# Patient Record
Sex: Male | Born: 1950 | Race: Black or African American | Hispanic: No | Marital: Married | Smoking: Never smoker
Health system: Southern US, Community
[De-identification: ages and names within clinical notes are randomized; demographics above are authoritative.]

## PROBLEM LIST (undated history)

## (undated) DIAGNOSIS — E785 Hyperlipidemia, unspecified: Secondary | ICD-10-CM

## (undated) DIAGNOSIS — R7303 Prediabetes: Secondary | ICD-10-CM

## (undated) DIAGNOSIS — I340 Nonrheumatic mitral (valve) insufficiency: Secondary | ICD-10-CM

## (undated) HISTORY — DX: Nonrheumatic mitral (valve) insufficiency: I34.0

## (undated) HISTORY — DX: Prediabetes: R73.03

## (undated) HISTORY — DX: Hyperlipidemia, unspecified: E78.5

---

## 2007-08-22 ENCOUNTER — Emergency Department (HOSPITAL_COMMUNITY): Admission: EM | Admit: 2007-08-22 | Discharge: 2007-08-22 | Payer: Self-pay | Admitting: Family Medicine

## 2008-01-03 ENCOUNTER — Emergency Department (HOSPITAL_COMMUNITY): Admission: EM | Admit: 2008-01-03 | Discharge: 2008-01-03 | Payer: Self-pay | Admitting: Emergency Medicine

## 2008-01-27 ENCOUNTER — Emergency Department (HOSPITAL_COMMUNITY): Admission: EM | Admit: 2008-01-27 | Discharge: 2008-01-27 | Payer: Self-pay | Admitting: Family Medicine

## 2008-05-18 ENCOUNTER — Emergency Department (HOSPITAL_COMMUNITY): Admission: EM | Admit: 2008-05-18 | Discharge: 2008-05-18 | Payer: Self-pay | Admitting: Emergency Medicine

## 2009-02-08 ENCOUNTER — Emergency Department (HOSPITAL_COMMUNITY): Admission: EM | Admit: 2009-02-08 | Discharge: 2009-02-08 | Payer: Self-pay | Admitting: Emergency Medicine

## 2009-04-11 ENCOUNTER — Emergency Department (HOSPITAL_COMMUNITY): Admission: EM | Admit: 2009-04-11 | Discharge: 2009-04-12 | Payer: Self-pay | Admitting: Emergency Medicine

## 2009-04-22 ENCOUNTER — Emergency Department (HOSPITAL_COMMUNITY): Admission: EM | Admit: 2009-04-22 | Discharge: 2009-04-22 | Payer: Self-pay | Admitting: Family Medicine

## 2009-04-29 ENCOUNTER — Encounter: Admission: RE | Admit: 2009-04-29 | Discharge: 2009-04-29 | Payer: Self-pay | Admitting: Orthopedic Surgery

## 2009-05-03 ENCOUNTER — Encounter: Admission: RE | Admit: 2009-05-03 | Discharge: 2009-05-03 | Payer: Self-pay | Admitting: Orthopedic Surgery

## 2009-05-07 ENCOUNTER — Encounter: Admission: RE | Admit: 2009-05-07 | Discharge: 2009-06-18 | Payer: Self-pay | Admitting: Orthopedic Surgery

## 2009-05-28 ENCOUNTER — Encounter: Admission: RE | Admit: 2009-05-28 | Discharge: 2009-05-28 | Payer: Self-pay | Admitting: Orthopedic Surgery

## 2009-06-01 ENCOUNTER — Encounter: Admission: RE | Admit: 2009-06-01 | Discharge: 2009-06-01 | Payer: Self-pay | Admitting: Orthopedic Surgery

## 2010-11-25 LAB — POCT URINALYSIS DIP (DEVICE)
Bilirubin Urine: NEGATIVE
Ketones, ur: NEGATIVE mg/dL
Operator id: 235561
pH: 6 (ref 5.0–8.0)

## 2011-04-28 ENCOUNTER — Encounter (HOSPITAL_COMMUNITY): Payer: Self-pay | Admitting: Emergency Medicine

## 2011-04-28 ENCOUNTER — Emergency Department (INDEPENDENT_AMBULATORY_CARE_PROVIDER_SITE_OTHER)
Admission: EM | Admit: 2011-04-28 | Discharge: 2011-04-28 | Disposition: A | Discharge status: Home / Self Care | Payer: BC Managed Care – PPO | Source: Home / Self Care | Attending: Family Medicine | Admitting: Family Medicine

## 2011-04-28 DIAGNOSIS — R05 Cough: Secondary | ICD-10-CM

## 2011-04-28 DIAGNOSIS — J310 Chronic rhinitis: Secondary | ICD-10-CM

## 2011-04-28 MED ORDER — FLUTICASONE PROPIONATE 50 MCG/ACT NA SUSP: 2.0000 | Freq: Every day | NASAL | Status: DC

## 2011-04-28 MED ORDER — HYDROCOD POLST-CHLORPHEN POLST 10-8 MG/5ML PO LQCR: 5.0000 mL | Freq: Two times a day (BID) | ORAL | Status: DC | PRN

## 2011-04-28 NOTE — Discharge Instructions (Signed)
Use the prescribed nasal spray as directed. Use the prescription cough syrup, as needed and as directed. Should you have aches, fever, or chills, I recommend aggressive control with acetaminophen (Tylenol) and/or ibuprofen. You may use these together, alternating them every 4 hours, or individually, every 8 hours. For example, take acetaminophen 500 to 1000 mg at 12 noon, then 600 to 800 mg of ibuprofen at 4 pm, then acetaminophen at 8 pm, etc. Also, stay hydrated with clear liquids. Return to care should your symptoms not improve, or worsen in any way.

## 2011-04-28 NOTE — ED Notes (Signed)
C/o cough that started Monday.  Denies runny nose now, but c/o chest congestion and pain in chest with coughing.  Denies fever at home.  Denies headache, denies general aches

## 2011-05-29 NOTE — ED Provider Notes (Signed)
History     CSN: [id number]  Arrival date & time 04/28/11  1819   First MD Initiated Contact with Patient 04/28/11 1914      Chief Complaint  Patient presents with  . Cough    (Consider location/radiation/quality/duration/timing/severity/associated sxs/prior treatment) HPI Comments: Cristian Tran presents for evaluation of cough, nasal congestion. He denies any fever.   Patient is a 61 y.o. male presenting with cough. The history is provided by the patient.  Cough This is a new problem. The current episode started 2 days ago. The problem occurs constantly. The cough is non-productive. Associated symptoms include chest pain. Pertinent negatives include no chills, no rhinorrhea, no shortness of breath and no wheezing.    History reviewed. No pertinent past medical history.  History reviewed. No pertinent past surgical history.  History reviewed. No pertinent family history.  History  Substance Use Topics  . Smoking status: Never Smoker   . Smokeless tobacco: Not on file  . Alcohol Use: No      Review of Systems  Constitutional: Negative.  Negative for chills.  HENT: Positive for congestion. Negative for rhinorrhea.   Eyes: Negative.   Respiratory: Positive for cough. Negative for shortness of breath and wheezing.   Cardiovascular: Positive for chest pain.  Gastrointestinal: Negative.   Genitourinary: Negative.   Musculoskeletal: Negative.   Skin: Negative.   Neurological: Negative.     Allergies  Review of patient's allergies indicates no known allergies.  Home Medications   Current Outpatient Rx  Name Route Sig Dispense Refill  . BENADRYL ALLERGY/COLD PO Oral Take by mouth.    Marland Kitchen HALLS COUGH DROPS MT Mouth/Throat Use as directed in the mouth or throat.    Marland Kitchen HYDROCOD POLST-CPM POLST ER 10-8 MG/5ML PO LQCR Oral Take 5 mLs by mouth every 12 (twelve) hours as needed. 140 mL 0  . FLUTICASONE PROPIONATE 50 MCG/ACT NA SUSP Nasal Place 2 sprays into the nose daily. 16 g 2     BP 155/89  Pulse 80  Temp(Src) 99.2 F (37.3 C) (Oral)  Resp 18  SpO2 100%  Physical Exam  Nursing note and vitals reviewed. Constitutional: He is oriented to person, place, and time. He appears well-developed and well-nourished.  HENT:  Head: Normocephalic and atraumatic.  Right Ear: Tympanic membrane is retracted.  Left Ear: Tympanic membrane is retracted.  Mouth/Throat: Uvula is midline, oropharynx is clear and moist and mucous membranes are normal.  Eyes: EOM are normal.  Neck: Normal range of motion.  Cardiovascular: Normal rate, regular rhythm, S1 normal, S2 normal and normal heart sounds.   No murmur heard. Pulmonary/Chest: Effort normal and breath sounds normal. He has no decreased breath sounds. He has no wheezes. He has no rhonchi.  Musculoskeletal: Normal range of motion.  Neurological: He is alert and oriented to person, place, and time.  Skin: Skin is warm and dry.  Psychiatric: His behavior is normal.    ED Course  Procedures (including critical care time)  Labs Reviewed - No data to display No results found.   1. Rhinitis   2. Cough       MDM  rx given for fluticasone and Tussionex Pennkinetic syrup        Renaee Munda, MD 05/29/11 2139

## 2013-04-26 ENCOUNTER — Emergency Department (HOSPITAL_COMMUNITY)
Admission: EM | Admit: 2013-04-26 | Discharge: 2013-04-26 | Disposition: A | Discharge status: Home / Self Care | Payer: BC Managed Care – PPO | Attending: Emergency Medicine | Admitting: Emergency Medicine

## 2013-04-26 ENCOUNTER — Encounter (HOSPITAL_COMMUNITY): Payer: Self-pay | Admitting: Emergency Medicine

## 2013-04-26 DIAGNOSIS — L0291 Cutaneous abscess, unspecified: Secondary | ICD-10-CM

## 2013-04-26 DIAGNOSIS — L02419 Cutaneous abscess of limb, unspecified: Secondary | ICD-10-CM | POA: Insufficient documentation

## 2013-04-26 DIAGNOSIS — L03119 Cellulitis of unspecified part of limb: Principal | ICD-10-CM

## 2013-04-26 MED ORDER — SULFAMETHOXAZOLE-TMP DS 800-160 MG PO TABS: 1.0000 | ORAL_TABLET | Freq: Two times a day (BID) | ORAL | Status: DC

## 2013-04-26 MED ORDER — CEPHALEXIN 500 MG PO CAPS: 500.0000 mg | ORAL_CAPSULE | Freq: Four times a day (QID) | ORAL | Status: DC

## 2013-04-26 NOTE — ED Provider Notes (Signed)
CSN: [id number]     Arrival date & time 04/26/13  2029 History  This chart was scribed for non-physician practitioner, Arthor CaptainAbigail Amisadai Woodford, PA-C working with Lyanne CoKevin M Campos, MD by Greggory StallionKayla Andersen, ED scribe. This patient was seen in room TR07C/TR07C and the patient's care was started at 8:49 PM.   Chief Complaint  Patient presents with  . Abscess   The history is provided by the patient. No language interpreter was used.   HPI Comments: Cristian Tran is a 63 y.o. male who presents to the Emergency Department complaining of an abscess to his left knee that started 2 days ago. He states it started out as itchy but is now also mildly painful. Pt has put peroxide over the area with no relief. Denies fever, chills.   History reviewed. No pertinent past medical history. History reviewed. No pertinent past surgical history. No family history on file. History  Substance Use Topics  . Smoking status: Never Smoker   . Smokeless tobacco: Not on file  . Alcohol Use: Yes    Review of Systems  Constitutional: Negative for fever and chills.  HENT: Negative for congestion.   Eyes: Negative for redness.  Respiratory: Negative for shortness of breath.   Cardiovascular: Negative for chest pain.  Gastrointestinal: Negative for abdominal distention.  Musculoskeletal: Negative for gait problem.  Skin: Positive for color change.       Positive for abscess.  Neurological: Negative for speech difficulty.  Psychiatric/Behavioral: Negative for confusion.   Allergies  Review of patient's allergies indicates no known allergies.  Home Medications   Current Outpatient Rx  Name  Route  Sig  Dispense  Refill  . chlorpheniramine-HYDROcodone (TUSSIONEX PENNKINETIC ER) 10-8 MG/5ML LQCR   Oral   Take 5 mLs by mouth every 12 (twelve) hours as needed.   140 mL   0   . Diphenhydramine-PE-APAP (BENADRYL ALLERGY/COLD PO)   Oral   Take by mouth.         . EXPIRED: fluticasone (FLONASE) 50 MCG/ACT nasal spray    Nasal   Place 2 sprays into the nose daily.   16 g   2   . Throat Lozenges (HALLS COUGH DROPS MT)   Mouth/Throat   Use as directed in the mouth or throat.          BP 142/80  Pulse 51  Temp(Src) 98.7 F (37.1 C) (Oral)  Resp 18  SpO2 98%  Physical Exam  Nursing note and vitals reviewed. Constitutional: He is oriented to person, place, and time. He appears well-developed and well-nourished. No distress.  HENT:  Head: Normocephalic and atraumatic.  Eyes: EOM are normal.  Neck: Neck supple. No tracheal deviation present.  Cardiovascular: Normal rate.   Pulmonary/Chest: Effort normal. No respiratory distress.  Musculoskeletal: Normal range of motion.  4 cm x 3 cm area of erythema and induration to left knee. Poorly demarcated. Central crusting with easily expressible purulent discharge from a central lesion. No fluctuance.   Neurological: He is alert and oriented to person, place, and time.  Skin: Skin is warm and dry.  Psychiatric: He has a normal mood and affect. His behavior is normal.    ED Course  Procedures (including critical care time)  DIAGNOSTIC STUDIES: Oxygen Saturation is 98% on RA, normal by my interpretation.    COORDINATION OF CARE: 8:52 PM-Discussed treatment plan which includes I&D and an antibiotic with pt at bedside and pt agreed to plan.  INCISION AND DRAINAGE Performed by: Arthor CaptainAbigail Zanae Kuehnle, PA-C  Consent: Verbal consent obtained. Risks and benefits: risks, benefits and alternatives were discussed Type: abscess  Body area: left knee  Anesthesia: local infiltration  Incision was made with a scalpel.  Local anesthetic: 2% lidocaine with epi  Anesthetic total: 1.5 ml  Complexity: complex Blunt dissection to break up loculations  Drainage: purulent  Drainage amount: minimal  Packing material: none   Patient tolerance: Patient tolerated the procedure well with no immediate complications.  Labs Review Labs Reviewed - No data to  display Imaging Review No results found.   EKG Interpretation None      MDM   Final diagnoses:  Abscess    Patient with skin abscess amenable to incision and drainage. . Encouraged home warm soaks and flushing.  Mild signs of cellulitis isnsurrounding skin.  Will d/c to home.  patient given abx to use only if signs of worsening infection  I personally performed the services described in this documentation, which was scribed in my presence. The recorded information has been reviewed and is accurate.    Arthor Captain, PA-C 04/26/13 2127

## 2013-04-26 NOTE — ED Notes (Signed)
Pt. reports abscess at left knee onset 2 days ago with no drainage .

## 2013-04-26 NOTE — Discharge Instructions (Signed)
SEEK MEDICAL CARE IF:   You notice red streaks coming from the infected area.  Your red area gets larger or turns dark in color.  Your bone or joint underneath the infected area becomes painful after the skin has healed.  Your infection returns in the same area or another area.  You notice a swollen bump in the infected area.  You develop new symptoms. SEEK IMMEDIATE MEDICAL CARE IF:   You have a fever.  You feel very sleepy.  You develop vomiting or diarrhea.  You have a general ill feeling (malaise) with muscle aches and pains.  Abscess An abscess is an infected area that contains a collection of pus and debris.It can occur in almost any part of the body. An abscess is also known as a furuncle or boil. CAUSES  An abscess occurs when tissue gets infected. This can occur from blockage of oil or sweat glands, infection of hair follicles, or a minor injury to the skin. As the body tries to fight the infection, pus collects in the area and creates pressure under the skin. This pressure causes pain. People with weakened immune systems have difficulty fighting infections and get certain abscesses more often.  SYMPTOMS Usually an abscess develops on the skin and becomes a painful mass that is red, warm, and tender. If the abscess forms under the skin, you may feel a moveable soft area under the skin. Some abscesses break open (rupture) on their own, but most will continue to get worse without care. The infection can spread deeper into the body and eventually into the bloodstream, causing you to feel ill.  DIAGNOSIS  Your caregiver will take your medical history and perform a physical exam. A sample of fluid may also be taken from the abscess to determine what is causing your infection. TREATMENT  Your caregiver may prescribe antibiotic medicines to fight the infection. However, taking antibiotics alone usually does not cure an abscess. Your caregiver may need to make a small cut (incision)  in the abscess to drain the pus. In some cases, gauze is packed into the abscess to reduce pain and to continue draining the area. HOME CARE INSTRUCTIONS   Only take over-the-counter or prescription medicines for pain, discomfort, or fever as directed by your caregiver.  If you were prescribed antibiotics, take them as directed. Finish them even if you start to feel better.  If gauze is used, follow your caregiver's directions for changing the gauze.  To avoid spreading the infection:  Keep your draining abscess covered with a bandage.  Wash your hands well.  Do not share personal care items, towels, or whirlpools with others.  Avoid skin contact with others.  Keep your skin and clothes clean around the abscess.  Keep all follow-up appointments as directed by your caregiver. MAKE SURE YOU:   Understand these instructions.  Will watch your condition.  Will get help right away if you are not doing well or get worse. Document Released: 11/19/2004 Document Revised: 08/11/2011 Document Reviewed: 04/24/2011 [facility] Patient Information 2014 [employer], [place].

## 2013-04-28 NOTE — ED Provider Notes (Signed)
Medical screening examination/treatment/procedure(s) were performed by non-physician practitioner and as supervising physician I was immediately available for consultation/collaboration.   EKG Interpretation None        Laticia Vannostrand M Marah Park, MD 04/28/13 0003 

## 2014-10-12 ENCOUNTER — Emergency Department (HOSPITAL_COMMUNITY)
Admission: EM | Admit: 2014-10-12 | Discharge: 2014-10-12 | Disposition: A | Discharge status: Home / Self Care | Payer: BLUE CROSS/BLUE SHIELD | Source: Home / Self Care | Attending: Emergency Medicine | Admitting: Emergency Medicine

## 2014-10-12 DIAGNOSIS — L989 Disorder of the skin and subcutaneous tissue, unspecified: Secondary | ICD-10-CM | POA: Diagnosis not present

## 2014-10-12 MED ORDER — TRIAMCINOLONE ACETONIDE 0.5 % EX CREA: 1.0000 "application " | TOPICAL_CREAM | Freq: Two times a day (BID) | CUTANEOUS | Status: DC

## 2014-10-12 NOTE — ED Provider Notes (Signed)
CSN: [id number]     Arrival date & time 10/12/14  1652 History   First MD Initiated Contact with Patient 10/12/14 1738     Chief Complaint  Patient presents with  . Rash   (Consider location/radiation/quality/duration/timing/severity/associated sxs/prior Treatment) HPI He is a 64 year old man here for evaluation of skin rash. He states there is a spot on his elbow that has been there for about a month. It is raised and itchy. He has tried multiple over-the-counter creams including cortisone, and anti-fungal cream, and anti-bacterial creams without improvement. He states he put peroxide on it which helped the itching. He reports spending a lot of time outdoors in the woods.  No past medical history on file. No past surgical history on file. No family history on file. Social History  Substance Use Topics  . Smoking status: Never Smoker   . Smokeless tobacco: Not on file  . Alcohol Use: Yes    Review of Systems As in history of present illness Allergies  Review of patient's allergies indicates no known allergies.  Home Medications   Prior to Admission medications   Medication Sig Start Date End Date Taking? Authorizing Provider  triamcinolone cream (KENALOG) 0.5 % Apply 1 application topically 2 (two) times daily. For 1 week 10/12/14   Charm Rings, MD   BP 132/81 mmHg  Pulse 55  Temp(Src) 97.4 F (36.3 C) (Oral)  Resp 16  SpO2 100% Physical Exam  Constitutional: He is oriented to person, place, and time. He appears well-developed and well-nourished. No distress.  Cardiovascular: Normal rate.   Pulmonary/Chest: Effort normal.  Neurological: He is alert and oriented to person, place, and time.  Skin:  1 cm raised scaly lesion just distal to the elbow on the dorsal forearm    ED Course  Procedures (including critical care time) Labs Review Labs Reviewed - No data to display  Imaging Review No results found.   MDM   1. Skin lesion    This seems most consistent  with chronic irritation versus precancerous skin lesion. Seborrheic keratosis is also possible. We'll treat with triamcinolone cream twice daily for 1 week. If no improvement, he will follow-up with the dermatologist for biopsy.    Charm Rings, MD 10/12/14 [phone]

## 2014-10-12 NOTE — Discharge Instructions (Signed)
This looks like a contact irritant. I do not think it is ringworm. It could also be a type of precancerous skin lesion. Please use the triamcinolone cream twice a day for the next week. If it is not getting better in 1 week, please call the dermatologist.

## 2014-10-12 NOTE — ED Notes (Signed)
Rash arm x couple of weeks

## 2015-03-29 ENCOUNTER — Encounter (HOSPITAL_COMMUNITY): Payer: Self-pay | Admitting: Emergency Medicine

## 2015-03-29 ENCOUNTER — Emergency Department (HOSPITAL_COMMUNITY)
Admission: EM | Admit: 2015-03-29 | Discharge: 2015-03-29 | Disposition: A | Discharge status: Home / Self Care | Payer: BLUE CROSS/BLUE SHIELD | Source: Home / Self Care | Attending: Family Medicine | Admitting: Family Medicine

## 2015-03-29 DIAGNOSIS — J069 Acute upper respiratory infection, unspecified: Secondary | ICD-10-CM | POA: Diagnosis not present

## 2015-03-29 NOTE — ED Notes (Signed)
C/o chest congestion onset yest associated w/a dry cough Denies runny nose, fevers, SOB, dyspnea A&O x4... No acute distress.

## 2015-03-29 NOTE — ED Provider Notes (Signed)
CSN: [id number]     Arrival date & time 03/29/15  1657 History   First MD Initiated Contact with Patient 03/29/15 1826     Chief Complaint  Patient presents with  . URI   (Consider location/radiation/quality/duration/timing/severity/associated sxs/prior Treatment) HPI History obtained from patient:   LOCATION:upper resp SEVERITY: no pain DURATION:1 day CONTEXT:sudden onset QUALITY:dry harsh cough MODIFYING FACTORS: musinex, alka seltzer plus ASSOCIATED SYMPTOMS: cough TIMING: contant OCCUPATION:landscaper  History reviewed. No pertinent past medical history. History reviewed. No pertinent past surgical history. No family history on file. Social History  Substance Use Topics  . Smoking status: Never Smoker   . Smokeless tobacco: None  . Alcohol Use: Yes    Review of Systems ROS +'ve cough  Denies: HEADACHE, NAUSEA, ABDOMINAL PAIN, CHEST PAIN, CONGESTION, DYSURIA, SHORTNESS OF BREATH  Allergies  Review of patient's allergies indicates no known allergies.  Home Medications   Prior to Admission medications   Medication Sig Start Date End Date Taking? Authorizing Provider  triamcinolone cream (KENALOG) 0.5 % Apply 1 application topically 2 (two) times daily. For 1 week 10/12/14   Charm Rings, MD   Meds Ordered and Administered this Visit  Medications - No data to display  BP 163/94 mmHg  Pulse 96  Temp(Src) 98.4 F (36.9 C) (Oral)  SpO2 99% No data found.   Physical Exam  Constitutional: He is oriented to person, place, and time. He appears well-developed and well-nourished. No distress.  HENT:  Head: Normocephalic and atraumatic.  Right Ear: External ear normal.  Left Ear: External ear normal.  Mouth/Throat: Oropharynx is clear and moist. No oropharyngeal exudate.  Eyes: Conjunctivae are normal.  Neck: Normal range of motion. Neck supple.  Pulmonary/Chest: Effort normal.  Abdominal: Soft.  Neurological: He is alert and oriented to person, place, and time.   Skin: Skin is warm and dry.  Psychiatric: He has a normal mood and affect. His behavior is normal.  Nursing note and vitals reviewed.   ED Course  Procedures (including critical care time)  Labs Review Labs Reviewed - No data to display  Imaging Review No results found.   Visual Acuity Review  Right Eye Distance:   Left Eye Distance:   Bilateral Distance:    Right Eye Near:   Left Eye Near:    Bilateral Near:         MDM   1. Acute URI    Patient is advised to continue home symptomatic treatment.  Patient is advised that if there are new or worsening symptoms or attend the emergency department, or contact primary care provider. Instructions of care provided discharged home in stable condition. Return to work/school note provided.  THIS NOTE WAS GENERATED USING A VOICE RECOGNITION SOFTWARE PROGRAM. ALL REASONABLE EFFORTS  WERE MADE TO PROOFREAD THIS DOCUMENT FOR ACCURACY.     Tharon Aquas, PA 03/29/15 [place]

## 2015-03-29 NOTE — Discharge Instructions (Signed)

## 2015-04-04 MED ORDER — AMOXICILLIN 500 MG PO CAPS
500.0000 mg | ORAL_CAPSULE | Freq: Three times a day (TID) | ORAL | Status: DC
Start: 2015-04-04 — End: 2021-07-23

## 2015-04-14 ENCOUNTER — Emergency Department (HOSPITAL_COMMUNITY): Payer: BLUE CROSS/BLUE SHIELD

## 2015-04-14 ENCOUNTER — Encounter (HOSPITAL_COMMUNITY): Payer: Self-pay | Admitting: Emergency Medicine

## 2015-04-14 ENCOUNTER — Emergency Department (HOSPITAL_COMMUNITY)
Admission: EM | Admit: 2015-04-14 | Discharge: 2015-04-14 | Disposition: A | Discharge status: Home / Self Care | Payer: BLUE CROSS/BLUE SHIELD | Attending: Emergency Medicine | Admitting: Emergency Medicine

## 2015-04-14 DIAGNOSIS — S4991XA Unspecified injury of right shoulder and upper arm, initial encounter: Secondary | ICD-10-CM | POA: Insufficient documentation

## 2015-04-14 DIAGNOSIS — Y998 Other external cause status: Secondary | ICD-10-CM | POA: Diagnosis not present

## 2015-04-14 DIAGNOSIS — M25511 Pain in right shoulder: Secondary | ICD-10-CM

## 2015-04-14 DIAGNOSIS — X58XXXA Exposure to other specified factors, initial encounter: Secondary | ICD-10-CM | POA: Insufficient documentation

## 2015-04-14 DIAGNOSIS — Z792 Long term (current) use of antibiotics: Secondary | ICD-10-CM | POA: Insufficient documentation

## 2015-04-14 DIAGNOSIS — Y9389 Activity, other specified: Secondary | ICD-10-CM | POA: Diagnosis not present

## 2015-04-14 DIAGNOSIS — Y9289 Other specified places as the place of occurrence of the external cause: Secondary | ICD-10-CM | POA: Insufficient documentation

## 2015-04-14 MED ORDER — NAPROXEN 500 MG PO TABS: 500.0000 mg | ORAL_TABLET | Freq: Two times a day (BID) | ORAL | Status: DC

## 2015-04-14 NOTE — ED Provider Notes (Signed)
History  By signing my name below, I, Karle Plumber, attest that this documentation has been prepared under the direction and in the presence of Melburn Hake, [place]. Electronically Signed: Karle Plumber, ED Scribe. 04/14/2015. 12:29 PM.  Chief Complaint  Patient presents with  . Shoulder Injury   The history is provided by the patient and medical records. No language interpreter was used.    HPI Comments:  Cristian Tran is a 65 y.o. male who presents to the Emergency Department complaining of right constant, sharp shoulder pain that started three days ago secondary to lifting a couch. Pt also reports working on sheet rock with his arms elevated over his head a couple weeks ago. He states the pain starts in the posterior right shoulder and radiates down to the right elbow. Pt reports taking Ibuprofen and applying heat with no significant relief of the pain. He states he has applied ice compresses with some relief of the pain. He denies modifying factors. He denies any previous injury to the shoulder. He denies weakness of the right shoulder or RUE, bruising, swelling, numbness or wounds. He denies any other injury, trauma or fall. He does not have a PCP. His orthopedist is Dr. Montez Morita and he plans on following up with him.   History reviewed. No pertinent past medical history. History reviewed. No pertinent past surgical history. History reviewed. No pertinent family history. Social History  Substance Use Topics  . Smoking status: Never Smoker   . Smokeless tobacco: None  . Alcohol Use: Yes    Review of Systems  Musculoskeletal: Positive for myalgias.  Skin: Negative for color change and wound.  Neurological: Negative for weakness and numbness.    Allergies  Review of patient's allergies indicates no known allergies.  Home Medications   Prior to Admission medications   Medication Sig Start Date End Date Taking? Authorizing Provider  amoxicillin (AMOXIL) 500 MG capsule  Take 1 capsule (500 mg total) by mouth 3 (three) times daily. 04/04/15   Tharon Aquas, PA  naproxen (NAPROSYN) 500 MG tablet Take 1 tablet (500 mg total) by mouth 2 (two) times daily. 04/14/15   Barrett Henle, PA-C  triamcinolone cream (KENALOG) 0.5 % Apply 1 application topically 2 (two) times daily. For 1 week 10/12/14   Charm Rings, MD   Triage Vitals: BP 148/80 mmHg  Pulse 61  Temp(Src) 97.7 F (36.5 C) (Oral)  Resp 20  SpO2 100% Physical Exam  Constitutional: He is oriented to person, place, and time. He appears well-developed and well-nourished.  HENT:  Head: Normocephalic and atraumatic.  Eyes: EOM are normal.  Neck: Normal range of motion.  Cardiovascular: Normal rate.   Cap refill less than 2 seconds. 2+ radial pulse to RUE.  Pulmonary/Chest: Effort normal.  Musculoskeletal: Normal range of motion.       Right shoulder: He exhibits tenderness. He exhibits normal range of motion, no bony tenderness, no swelling, no effusion, no crepitus, no deformity, no laceration, no spasm, normal pulse and normal strength.  Mild tenderness over posterior right deltoid and lateral latissimus muscles. Full ROM.  Neurological: He is alert and oriented to person, place, and time.  5/5 strength to right shoulder, elbow, wrist and hand. Distal sensations intact.  Skin: Skin is warm and dry.  Psychiatric: He has a normal mood and affect. His behavior is normal.  Nursing note and vitals reviewed.   ED Course  Procedures (including critical care time) DIAGNOSTIC STUDIES: Oxygen Saturation is 100% on RA,  normal by my interpretation.   COORDINATION OF CARE: 12:25 PM- Informed pt that his X-Ray was negative. Will prescribe Naproxen. Pt verbalizes understanding and agrees to plan.  Medications - No data to display  Labs Review Labs Reviewed - No data to display  Imaging Review Dg Shoulder Right  04/14/2015  CLINICAL DATA:  Right shoulder pain after lifting injury EXAM: RIGHT  SHOULDER - 2+ VIEW COMPARISON:  None. FINDINGS: There is no evidence of fracture or dislocation. There is no evidence of arthropathy or other focal bone abnormality. Soft tissues are unremarkable. IMPRESSION: Negative. Electronically Signed   By: Delbert Phenix M.D.   On: 04/14/2015 10:53   I have personally reviewed and evaluated these images and lab results as part of my medical decision-making.    MDM   Final diagnoses:  Right shoulder pain    Patient X-Ray negative for obvious fracture or dislocation.  Pt advised to follow up with his orthopedist, Dr. Montez Morita. Conservative therapy recommended and discussed. Patient will be discharged home & is agreeable with above plan. Returns precautions discussed. Pt appears safe for discharge.  I personally performed the services described in this documentation, which was scribed in my presence. The recorded information has been reviewed and is accurate.     Satira Sark Leroy, [place] 04/14/15 1238  Vanetta Mulders, MD 04/16/15 [phone]

## 2015-04-14 NOTE — Discharge Instructions (Signed)
Take her medications as prescribed. Recommend continuing to apply ice to affected area for 15-20 minutes 3-4 times daily for pain relief. Refrain from doing any heavy lifting, excessive exercising or repetitive movements that exacerbate your symptoms for the next few days. Please follow up with a primary care provider from the Resource Guide provided below in 1 week as needed. Please return to the Emergency Department if symptoms worsen or new onset of swelling, numbness, tingling, weakness.    Emergency Department Resource Guide 1) Find a Doctor and Pay Out of Pocket Although you won't have to find out who is covered by your insurance plan, it is a good idea to ask around and get recommendations. You will then need to call the office and see if the doctor you have chosen will accept you as a new patient and what types of options they offer for patients who are self-pay. Some doctors offer discounts or will set up payment plans for their patients who do not have insurance, but you will need to ask so you aren't surprised when you get to your appointment.  2) Contact Your Local Health Department Not all health departments have doctors that can see patients for sick visits, but many do, so it is worth a call to see if yours does. If you don't know where your local health department is, you can check in your phone book. The CDC also has a tool to help you locate your state's health department, and many state websites also have listings of all of their local health departments.  3) Find a Walk-in Clinic If your illness is not likely to be very severe or complicated, you may want to try a walk in clinic. These are popping up all over the country in pharmacies, drugstores, and shopping centers. They're usually staffed by nurse practitioners or physician assistants that have been trained to treat common illnesses and complaints. They're usually fairly quick and inexpensive. However, if you have serious medical  issues or chronic medical problems, these are probably not your best option.  No Primary Care Doctor: - Call Health Connect at  [phone] - they can help you locate a primary care doctor that  accepts your insurance, provides certain services, etc. - Physician Referral Service- [phone]  Chronic Pain Problems: Organization         Address  Phone   Notes  [facility]  [phone] Patients need to be referred by their primary care doctor.   Medication Assistance: Organization         Address  Phone   Notes  [facility] Medication [facility] [address] [place] [place], [place] [address] [phone] --Must be a resident of [facility] -- Must have NO insurance coverage whatsoever (no Medicaid/ Medicare, etc.) -- The pt. MUST have a primary care doctor that directs their care regularly and follows them in the community   [facility]  [phone]   [employer]  [phone]    Agencies that provide inexpensive medical care: Organization         Address  Phone   Notes  [facility]  [phone]   [facility]    [phone]   [facility] [address] [place], [place] [address] [phone]   [facility] [address] [place] [place] [phone]   Planned Parenthood    [phone]   [facility]    [phone]  [phone]   [place] [address], [place] Phone:  [phone], Fax:  [phone] Hours of Operation:  9 am - 6 pm, M-F.  Also accepts Medicaid/Medicare and self-pay.  [facility] [place], [address], [place] Phone: [phone], Fax: [phone]. Hours of Operation:  8:30 am - 5:30 pm, M-F.  Also accepts Medicaid and self-pay.  [facility] [address] [place] Phone: [phone]   [place], [place], [place]  [phone], Ext. [phone] Mondays & Thursdays: 7-9 AM.  First 15 patients are seen on a first come, first serve basis.    [place] Providers:  Organization         Address  Phone   Notes  [facility] [address], [place] [phone] Also accepts self-pay patients.  [facility] [address], [place]  [phone]   [place], [address], [place] [phone]   [facility] [address] [place] [phone]   Lucianne Lei [address], [place]   [phone] Only accepts [place] Access [place] patients after they have their name applied to their card.   Self-Pay (no insurance) in [facility]:  Organization         Address  Phone   Notes  Sickle Cell Patients, [facility] Internal [facility] [place] [phone]   [facility] [place] [phone]   [facility] [place]  [place], [place], [place] [phone]   [facility]/Dr. Osei-Bonsu  [address], [place] or [address], [place] [phone] Phone number for both [place] and [place] locations is the same.  [facility] [address] [place] [phone]   [facility] [address] [place] or [address] [phone]   [facility] [address] [place] [phone], phone; [phone], fax Sees patients 1st and 3rd Saturday of every month.  Must not qualify for public or private insurance (i.e. Medicaid, Medicare, [place] Health Choice, Veterans' Benefits)  Household income should be no more than 200% of the poverty level The clinic cannot treat you if you are pregnant or think you are pregnant  Sexually transmitted  diseases are not treated at the clinic.    Dental Care: Organization         Address  Phone  Notes  [facility] [phone] Accepts children up to age 10 who are enrolled in [place] or [place]; pregnant women with a Medicaid card; and children who have applied for Medicaid or [place] Health Choice, but were declined, whose parents can pay a reduced fee at time of service.  [facility]  [address], [place] [phone] Accepts children up to age 22 who are enrolled in [place] or [place]; pregnant women with a Medicaid card; and children who have applied for Medicaid or [place] Health Choice, but were declined, whose parents can pay a reduced fee at time of service.  [place]  Access PROGRAM  [address] [place] [phone] Patients are seen by appointment only. Walk-ins are not accepted. [place] will see patients 57 years of age and older. Monday - Tuesday (8am-5pm) Most Wednesdays (8:30-5pm) $30 per visit, cash only  [facility] Adult Dental Access PROGRAM  [address], [place] [facility] [phone] Patients are seen by appointment only. Walk-ins are not accepted. [place] will see patients 61 years of age and older. One Wednesday Evening (Monthly: Volunteer Based).  $30 per visit, cash only  [place]  [phone] for adults; Children under age 78, call Graduate Pediatric Dentistry at [phone]. Children aged 60-14, please call [phone] to request a pediatric application.  Dental services are provided in all areas of dental care including fillings, crowns and bridges, complete and partial dentures, implants, gum treatment, root canals, and extractions. Preventive care is also provided. Treatment is provided to both adults and children. Patients are selected via a  lottery and there is often a waiting list.   [facility] [address] [place]  [phone] [email or web address]   [facility] [address] [place], [place] [phone], Ext. [phone] Second and Fourth Thursday of each month, opens at 6:30 AM; Clinic ends at 9 AM.  Patients are seen on a first-come first-served basis, and a limited number are seen during each clinic.   [facility]  [address] Hillard Danker [place], [place] [phone]   Eligibility Requirements You must have lived in [place], [place], or [place] counties for at least the last three months.   You cannot be eligible for state or federal sponsored [employer], including [employer], [place], or [employer].   You generally cannot be eligible for healthcare insurance through your employer.    How to apply: Eligibility screenings are held every Tuesday and Wednesday afternoon from 1:00 pm until 4:00 pm. You do not need an appointment for the interview!  [facility] [address], [place], [place]   [place]  [place] Department  [place]  [phone]    Behavioral Health Resources in the Community: Intensive Outpatient Programs Organization         Address  Phone  Notes  [place] [address], [place], [place] [phone]   [facility] Outpatient [address], [place], [place]   ADS: Alcohol [facility] Drug S[facility] [address], [place], [place]   [place] [address]  [place], [place] or [phone]   Substance Abuse Resources Organization         Address  Phone  Notes  Alcohol and Drug Services  [phone]   [place]  [phone]   [facility] [place]   Chinita Pester  [phone]   Residential &  Outpatient Substance Abuse Program  [phone]   Psychological Services Organization         Address  Phone  Notes  [facility] [place]  [place]  [phone]   [place] [address] [place] or [phone]    Mobile Crisis Teams Organization         Address  Phone  Notes  [facility], Mobile Crisis Care Unit  [phone]   [facility]  [address] [place],  [place] [phone]   [facility] [address] [place] [phone]    Self-Help/Support Groups Organization         Address  Phone             Notes  [facility] [place] - variety of support groups  336- I7437963 Call for more information  Narcotics Anonymous (NA), [facility] [address], [employer] [place]  2 meetings at this location   Statistician         Address  Phone  Notes  [facility] [address],    [place]  [phone]   [facility]  [address] [place] [address], [place], [place] [phone]   [facility] Treatment Facility [address] [place], [place] [phone] Admissions: 8am-3pm M-F  [facility] [address],    [place], [place] [phone]   [facility] [address] [place], [place], [place] [phone]   [facility] ([place]) [address],  [place], [place] [phone]   [facility] - Intensive Outpatient [address], [place], [place] [phone]   [facility] (Addiction Recovery [facility] [address] [place].,  [place], [place] [phone] or [phone]   [facility] ([facility]) [address] [place], [place] [phone] Accepts Medicaid  [facility] [address]  [place] [phone] Substance Abuse/Addiction Treatment   [facility] Organization          Address  Phone  Notes  [facility]  [phone]   Angie Fava, PhD [address] Ervin Knack [place], [place]   [phone] or [phone]   [facility]   [address] [place], [place] [phone]   [facility] [address] [place], [place] [phone] Insurance/Medicaid/sponsorship through [facility] and Families [address]                                    [place], [place] [phone] Therapy/tele-psych/case  [facility] [address], [place] [phone]    Dr. Lolly Mustache  [phone]   [facility]  [employer] [place] [facility] Dept. 1) [address],  2) [address] [place] 3)  [address], [place] [phone]  [phone]   [facility] Child Abuse Hotline 941-625-5283 or [phone] (After Hours)

## 2015-04-14 NOTE — ED Notes (Addendum)
Pt lifted a couch Thursday, since then began having right shoulder pain radiating down into right elbow. States the pain has been worsening, able to move all joints with pain, no obvious deformities.

## 2015-08-22 ENCOUNTER — Encounter (HOSPITAL_COMMUNITY): Payer: Self-pay | Admitting: Emergency Medicine

## 2015-08-22 ENCOUNTER — Emergency Department (HOSPITAL_COMMUNITY)
Admission: EM | Admit: 2015-08-22 | Discharge: 2015-08-22 | Disposition: A | Discharge status: Home / Self Care | Payer: BLUE CROSS/BLUE SHIELD | Attending: Emergency Medicine | Admitting: Emergency Medicine

## 2015-08-22 DIAGNOSIS — W57XXXA Bitten or stung by nonvenomous insect and other nonvenomous arthropods, initial encounter: Secondary | ICD-10-CM | POA: Insufficient documentation

## 2015-08-22 DIAGNOSIS — Y999 Unspecified external cause status: Secondary | ICD-10-CM | POA: Diagnosis not present

## 2015-08-22 DIAGNOSIS — Y929 Unspecified place or not applicable: Secondary | ICD-10-CM | POA: Diagnosis not present

## 2015-08-22 DIAGNOSIS — S80861A Insect bite (nonvenomous), right lower leg, initial encounter: Secondary | ICD-10-CM | POA: Diagnosis not present

## 2015-08-22 DIAGNOSIS — Y939 Activity, unspecified: Secondary | ICD-10-CM | POA: Diagnosis not present

## 2015-08-22 NOTE — ED Notes (Signed)
PT DISCHARGED. INSTRUCTIONS GIVEN. AAOX4. PT IN NO APPARENT DISTRESS OR PAIN. THE OPPORTUNITY TO ASK QUESTIONS WAS PROVIDED. 

## 2015-08-22 NOTE — ED Provider Notes (Signed)
CSN: [id number]     Arrival date & time 08/22/15  1647 History  By signing my name below, I, Evon Slackerrance Branch, attest that this documentation has been prepared under the direction and in the presence of Audry Piliyler Chia Mowers, PA-C. Electronically Signed: Evon Slackerrance Branch, ED Scribe. 08/22/2015. 5:49 PM.  Chief Complaint  Patient presents with  . Insect Bite   The history is provided by the patient. No language interpreter was used.   HPI Comments: Cristian Tran is a 65 y.o. male who presents to the Emergency Department complaining of possible insect bites to right upper and right lower leg onset 2 days priors. Pt states that they are itchy. 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 Pt states that he has applied cortisone cream with some relief. Denies redness, discharge, rash, fever, nausea, vomiting or abdominal pain.   History reviewed. No pertinent past medical history. History reviewed. No pertinent past surgical history. History reviewed. No pertinent family history. Social History  Substance Use Topics  . Smoking status: Never Smoker   . Smokeless tobacco: None  . Alcohol Use: Yes    Review of Systems  Constitutional: Negative for fever.  Gastrointestinal: Negative for nausea, vomiting and abdominal pain.  Skin: Negative for color change and rash.   Allergies  Review of patient's allergies indicates no known allergies.  Home Medications   Prior to Admission medications   Medication Sig Start Date End Date Taking? Authorizing Provider  amoxicillin (AMOXIL) 500 MG capsule Take 1 capsule (500 mg total) by mouth 3 (three) times daily. 04/04/15   Tharon AquasFrank C Patrick, PA  naproxen (NAPROSYN) 500 MG tablet Take 1 tablet (500 mg total) by mouth 2 (two) times daily. 04/14/15   Barrett HenleNicole Elizabeth Nadeau, PA-C  triamcinolone cream (KENALOG) 0.5 % Apply 1 application topically 2 (two) times daily. For 1 week 10/12/14   Charm RingsErin J Honig, MD   BP 118/76 mmHg  Pulse 65   Temp(Src) 97.7 F (36.5 C) (Oral)  Resp 14  Ht 6\' 4"  (1.93 m)  Wt 190 lb (86.183 kg)  BMI 23.14 kg/m2  SpO2 100%   Physical Exam  Constitutional: He is oriented to person, place, and time. He appears well-developed and well-nourished. No distress.  HENT:  Head: Normocephalic and atraumatic.  Eyes: Conjunctivae and EOM are normal.  Neck: Neck supple. No tracheal deviation present.  Cardiovascular: Normal rate and regular rhythm.   Pulmonary/Chest: Effort normal. No respiratory distress.  Abdominal: Soft.  Musculoskeletal: Normal range of motion.  Neurological: He is alert and oriented to person, place, and time.  Skin: Skin is warm and dry.  Scattered <771mm lesions (4 total) on right leg. No erythema. No purulence. No induration. No signs of infection.   Psychiatric: He has a normal mood and affect. His behavior is normal. Thought content normal.  Nursing note and vitals reviewed.  ED Course  Procedures (including critical care time) DIAGNOSTIC STUDIES: Oxygen Saturation is 100% on RA, normal by my interpretation.    COORDINATION OF CARE: 5:47 PM-Discussed treatment plan with pt at bedside and pt agreed to plan.   Labs Review Labs Reviewed - No data to display  Imaging Review No results found.    EKG Interpretation None      MDM  I have reviewed the relevant previous healthcare records. I obtained HPI from historian.  ED Course:  Assessment: Pt is a 64yM with scattered <141mm lesions from possible insect bote on right leg.  Pt works outdoors. No pain. Mild itching. On exam, pt in NAD. Nontoxic/nonseptic appearing. VSS. Afebrile. Lesions non erythematous. Non infectious. No necrosis.counseled on clamaine lotion and hydrocortisone cream for itching. Plan is to DC home with follow up to PCP. At time of discharge, Patient is in no acute distress. Vital Signs are stable. Patient is able to ambulate. Patient able to tolerate PO.     Disposition/Plan:  DC Home Additional  Verbal discharge instructions given and discussed with patient.  Pt Instructed to f/u with PCP in the next week for evaluation and treatment of symptoms. Return precautions given Pt acknowledges and agrees with plan  Supervising Physician Tilden FossaElizabeth Rees, MD   Final diagnoses:  Insect bites    I personally performed the services described in this documentation, which was scribed in my presence. The recorded information has been reviewed and is accurate.     Audry Piliyler Cecil Bixby, PA-C 08/22/15 1755  Tilden FossaElizabeth Rees, MD 08/24/15 [phone]

## 2015-08-22 NOTE — ED Notes (Signed)
Pt has insect bites to back of R thigh and R ankle.

## 2015-08-22 NOTE — Discharge Instructions (Signed)
Please read and follow all provided instructions.  Your diagnoses today include:  1. Insect bites    Tests performed today include:  Vital signs. See below for your results today.   Medications prescribed:   Take as prescribed   Home care instructions:  Follow any educational materials contained in this packet.  You can use a hydrocortisone cream or calamine lotion to help with the itching   Follow-up instructions: Please follow-up with your primary care provider for further evaluation of symptoms and treatment   Return instructions:   Please return to the Emergency Department if you do not get better, if you get worse, or new symptoms OR  - Fever (temperature greater than 101.35F)  - Bleeding that does not stop with holding pressure to the area    -Severe pain (please note that you may be more sore the day after your accident)  - Chest Pain  - Difficulty breathing  - Severe nausea or vomiting  - Inability to tolerate food and liquids  - Passing out  - Skin becoming red around your wounds  - Change in mental status (confusion or lethargy)  - New numbness or weakness     Please return if you have any other emergent concerns.  Additional Information:  Your vital signs today were: BP 118/76 mmHg   Pulse 65   Temp(Src) 97.7 F (36.5 C) (Oral)   Resp 14   Ht 6\' 4"  (1.93 m)   Wt 86.183 kg   BMI 23.14 kg/m2   SpO2 100% If your blood pressure (BP) was elevated above 135/85 this visit, please have this repeated by your doctor within one month. ---------------

## 2016-08-01 ENCOUNTER — Emergency Department (HOSPITAL_COMMUNITY)
Admission: EM | Admit: 2016-08-01 | Discharge: 2016-08-01 | Disposition: A | Discharge status: Home / Self Care | Payer: BLUE CROSS/BLUE SHIELD | Attending: Emergency Medicine | Admitting: Emergency Medicine

## 2016-08-01 ENCOUNTER — Encounter (HOSPITAL_COMMUNITY): Payer: Self-pay

## 2016-08-01 DIAGNOSIS — Y92009 Unspecified place in unspecified non-institutional (private) residence as the place of occurrence of the external cause: Secondary | ICD-10-CM | POA: Diagnosis not present

## 2016-08-01 DIAGNOSIS — Y939 Activity, unspecified: Secondary | ICD-10-CM | POA: Diagnosis not present

## 2016-08-01 DIAGNOSIS — X088XXA Exposure to other specified smoke, fire and flames, initial encounter: Secondary | ICD-10-CM | POA: Diagnosis not present

## 2016-08-01 DIAGNOSIS — H5712 Ocular pain, left eye: Secondary | ICD-10-CM | POA: Diagnosis present

## 2016-08-01 DIAGNOSIS — S0502XA Injury of conjunctiva and corneal abrasion without foreign body, left eye, initial encounter: Secondary | ICD-10-CM | POA: Insufficient documentation

## 2016-08-01 DIAGNOSIS — Y999 Unspecified external cause status: Secondary | ICD-10-CM | POA: Insufficient documentation

## 2016-08-01 MED ORDER — FLUORESCEIN SODIUM 0.6 MG OP STRP
1.0000 | ORAL_STRIP | Freq: Once | OPHTHALMIC | Status: AC
Start: 2016-08-01 — End: 2016-08-01
  Administered 2016-08-01: 1 via OPHTHALMIC
  Filled 2016-08-01: qty 1

## 2016-08-01 MED ORDER — ERYTHROMYCIN 5 MG/GM OP OINT: TOPICAL_OINTMENT | OPHTHALMIC | 0 refills | Status: DC

## 2016-08-01 MED ORDER — TETRACAINE HCL 0.5 % OP SOLN
2.0000 [drp] | Freq: Once | OPHTHALMIC | Status: AC
  Administered 2016-08-01: 2 [drp] via OPHTHALMIC
  Filled 2016-08-01: qty 4

## 2016-08-01 NOTE — ED Triage Notes (Signed)
Pt reports the presence of suspected foreign body in his left eye. Pt presents with redness, irritation, and watery left eye. Pt does not recall an object entering his left eye.

## 2016-08-01 NOTE — ED Notes (Signed)
Pt able to see with no problem at this time. Pt denies any blurred vision

## 2016-08-01 NOTE — Discharge Instructions (Signed)
It was my pleasure taking care of you today!   Apply topical antibiotic ointment to eye as directed. Please call the ophthalmologist listed or your own eye doctor to schedule a follow up appointment.   Return to ER for new or worsening symptoms, any additional concerns.

## 2016-08-01 NOTE — ED Provider Notes (Signed)
[facility]-EMERGENCY DEPT Provider Note   CSN: [id number] Arrival date & time: 08/01/16  4098     History   Chief Complaint Chief Complaint  Patient presents with  . Foreign Body in Eye    Left    HPI Cristian Tran is a 66 y.o. male.  The history is provided by the patient and medical records. No language interpreter was used.  Foreign Body in Eye    Cristian Tran is a 66 y.o. 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 He does not remember anything in particular getting in his eye, however does note that there was a good bit of smoke, ashes, etc. In the air while he was cooking. Pain with blinking, but if he keeps his eye shut, there is no pain. He tried to flush the eye out with water last night - little improvement. No medications or drops prior to arrival. No history of similar. No change in vision. Eyes will intermittently water, but no other discharge. Does not itch.   History reviewed. No pertinent past medical history.  There are no active problems to display for this patient.   History reviewed. No pertinent surgical history.     Home Medications    Prior to Admission medications   Medication Sig Start Date End Date Taking? Authorizing Provider  amoxicillin (AMOXIL) 500 MG capsule Take 1 capsule (500 mg total) by mouth 3 (three) times daily. 04/04/15   Tharon Aquas, PA  erythromycin ophthalmic ointment Place a 1/2 inch ribbon of ointment into the lower eyelid 4x per day. 08/01/16   Ward, Chase Picket, PA-C  naproxen (NAPROSYN) 500 MG tablet Take 1 tablet (500 mg total) by mouth 2 (two) times daily. 04/14/15   Barrett Henle, PA-C  triamcinolone cream (KENALOG) 0.5 % Apply 1 application topically 2 (two) times daily. For 1 week 10/12/14   Charm Rings, MD    Family History History reviewed. No pertinent family history.  Social History Social  History  Substance Use Topics  . Smoking status: Never Smoker  . Smokeless tobacco: Not on file  . Alcohol use Yes     Allergies   Patient has no known allergies.   Review of Systems Review of Systems  Constitutional: Negative for fever.  HENT: Negative for congestion and sore throat.   Eyes: Positive for pain and redness. Negative for photophobia, itching and visual disturbance.     Physical Exam Updated Vital Signs BP (!) 153/90 (BP Location: Left Arm)   Pulse 62   Temp 97.7 F (36.5 C) (Oral)   Resp 16   SpO2 100%   Physical Exam  Constitutional: He appears well-developed and well-nourished. No distress.  HENT:  Head: Normocephalic and atraumatic.  Nose: Nose normal.  Mouth/Throat: Oropharynx is clear and moist.  Eyes: EOM and lids are normal. Pupils are equal, round, and reactive to light. Lids are everted and swept, no foreign bodies found.  Slit lamp exam:      The left eye shows corneal abrasion and fluorescein uptake.  Neck: Neck supple.  Cardiovascular: Normal rate, regular rhythm and normal heart sounds.   No murmur heard. Pulmonary/Chest: Effort normal and breath sounds normal. No respiratory distress. He has no wheezes. He has no rales.  Musculoskeletal: Normal range of motion.  Neurological: He is alert.  Skin: Skin is warm and dry.  Nursing note and vitals reviewed.  ED Treatments / Results  Labs (all labs ordered are listed, but only abnormal results are displayed) Labs Reviewed - No data to display  EKG  EKG Interpretation None       Radiology No results found.  Procedures Procedures (including critical care time)  Medications Ordered in ED Medications  tetracaine (PONTOCAINE) 0.5 % ophthalmic solution 2 drop (2 drops Left Eye Given 08/01/16 0659)  fluorescein ophthalmic strip 1 strip (1 strip Left Eye Given 08/01/16 0659)     Initial Impression / Assessment and Plan / ED Course  I have reviewed the triage vital signs and the  nursing notes.  Pertinent labs & imaging results that were available during my care of the patient were reviewed by me and considered in my medical decision making (see chart for details).    Cristian Tran is a 66 y.o. male who presents to ED for left eye irritation and redness. 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 Fluorescein uptake c/w corneal abrasion on exam. Eye was thoroughly irrigated and examined, including eyelid eversion with no evidence of foreign body. Exam not concerning for orbital cellulitis or hyphema. No concern for uveitis. Patient will be discharged home with erythromycin ointment.  Patient understands to follow up with ophthalmology. Return precautions discussed. Patient appears safe for discharge and all questions were answered.  Final Clinical Impressions(s) / ED Diagnoses   Final diagnoses:  Abrasion of left cornea, initial encounter    New Prescriptions New Prescriptions   ERYTHROMYCIN OPHTHALMIC OINTMENT    Place a 1/2 inch ribbon of ointment into the lower eyelid 4x per day.     Ward, Chase PicketJaime Pilcher, PA-C 08/01/16 [id number]    Azalia Bilisampos, Kevin, MD 08/01/16 1004

## 2017-11-26 IMAGING — CR DG SHOULDER 2+V*R*
3 series · 3 of 3 positions shown · non-contrast
Comparison: None.

CLINICAL DATA: Right shoulder pain after lifting injury

EXAM:
RIGHT SHOULDER - 2+ VIEW

[w shoulder external right]
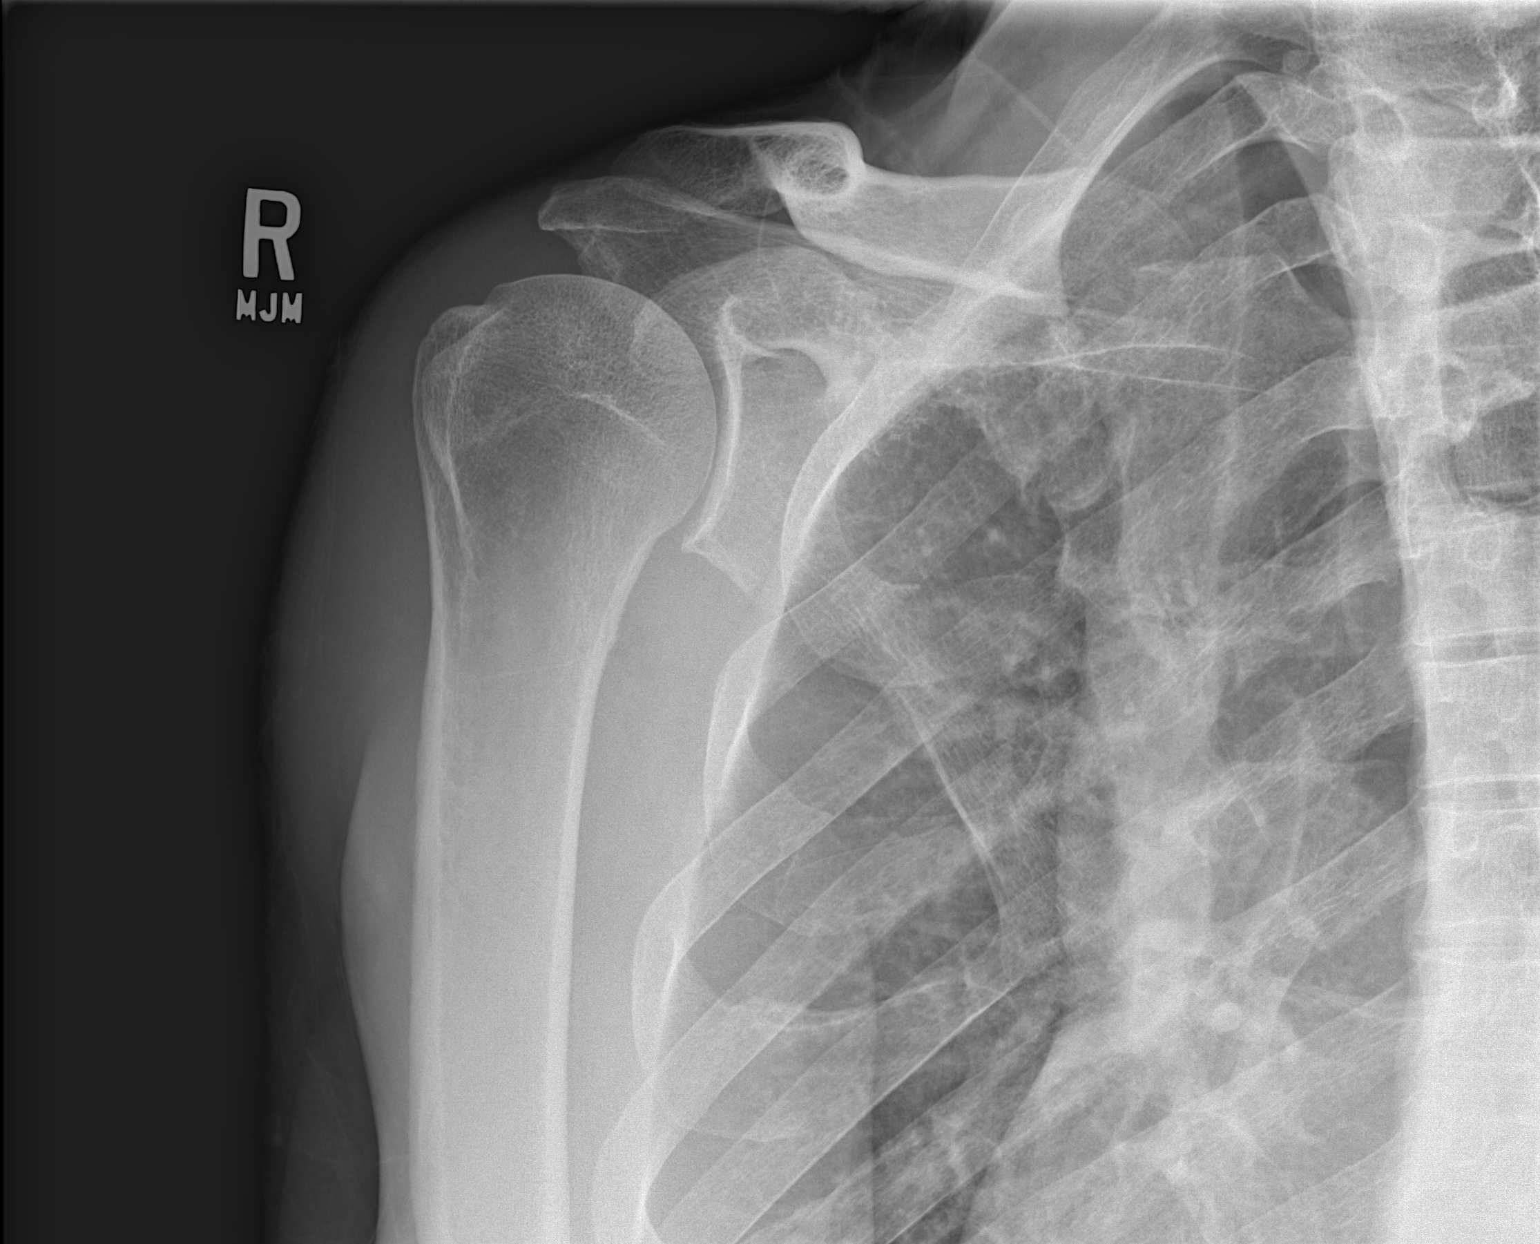

[w shoulder y-view right]
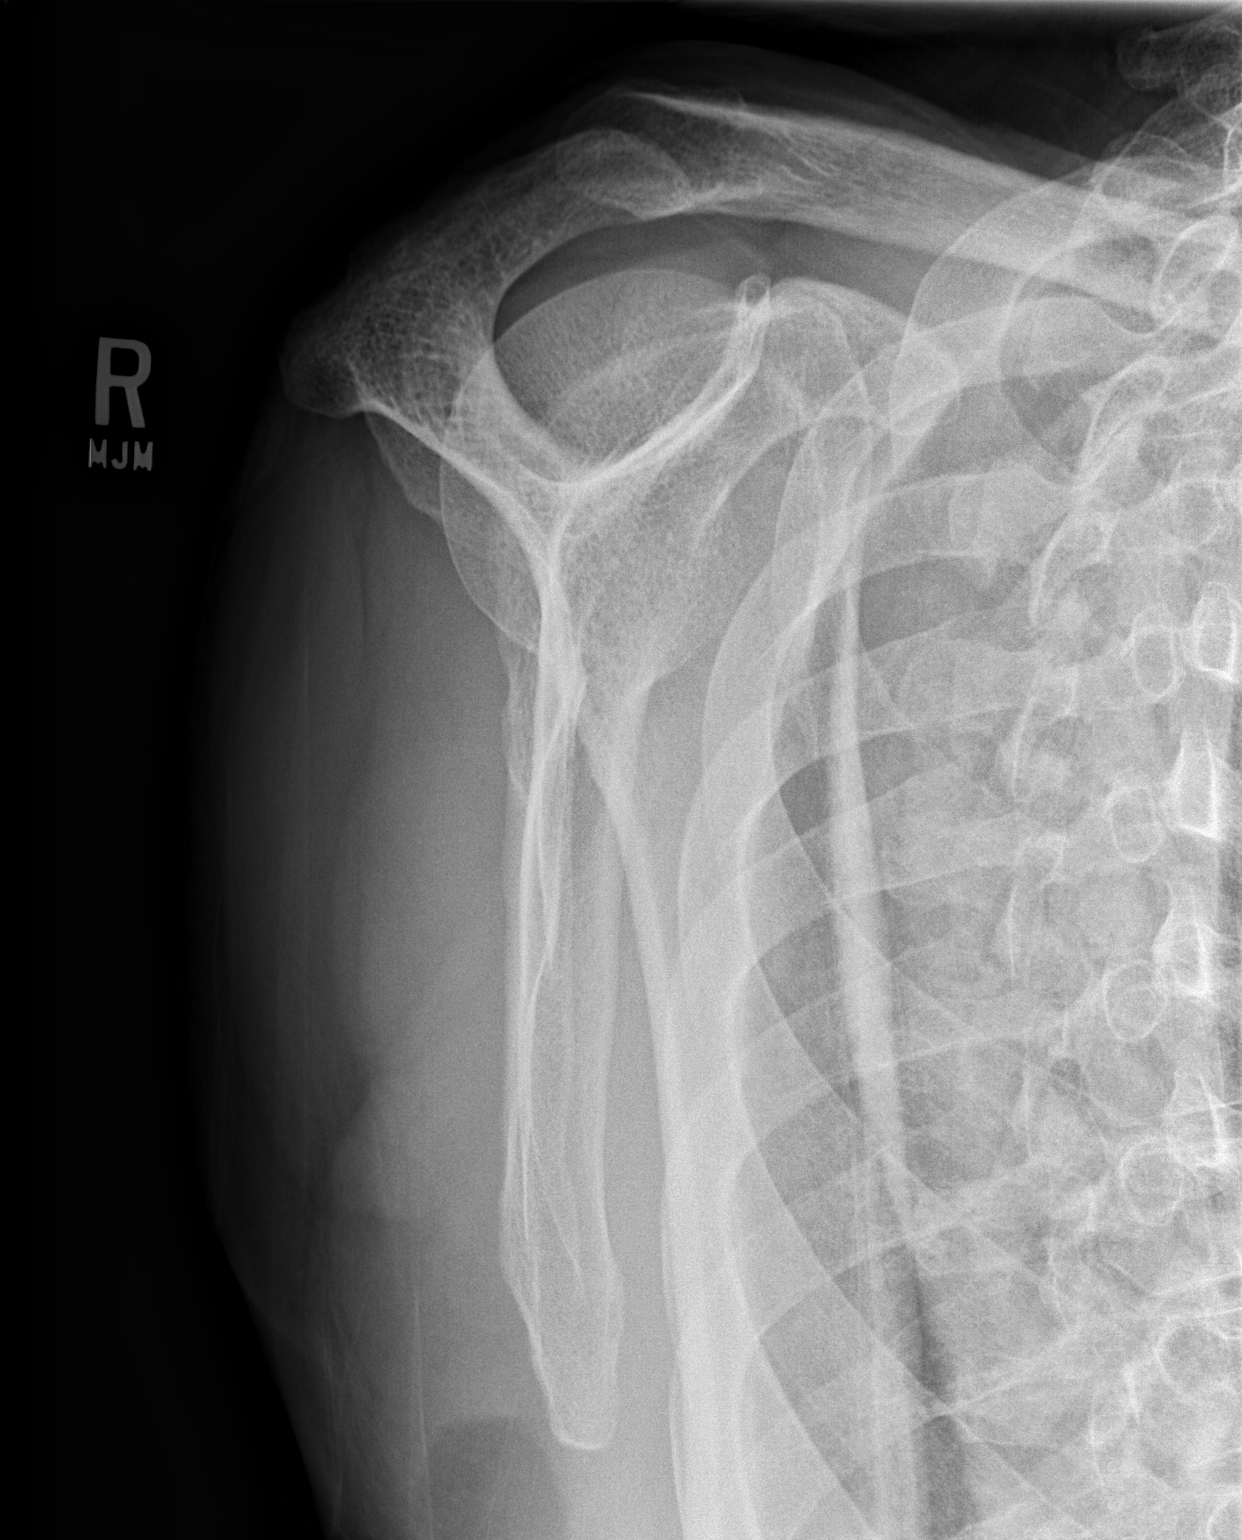

[x shoulder axillary right]
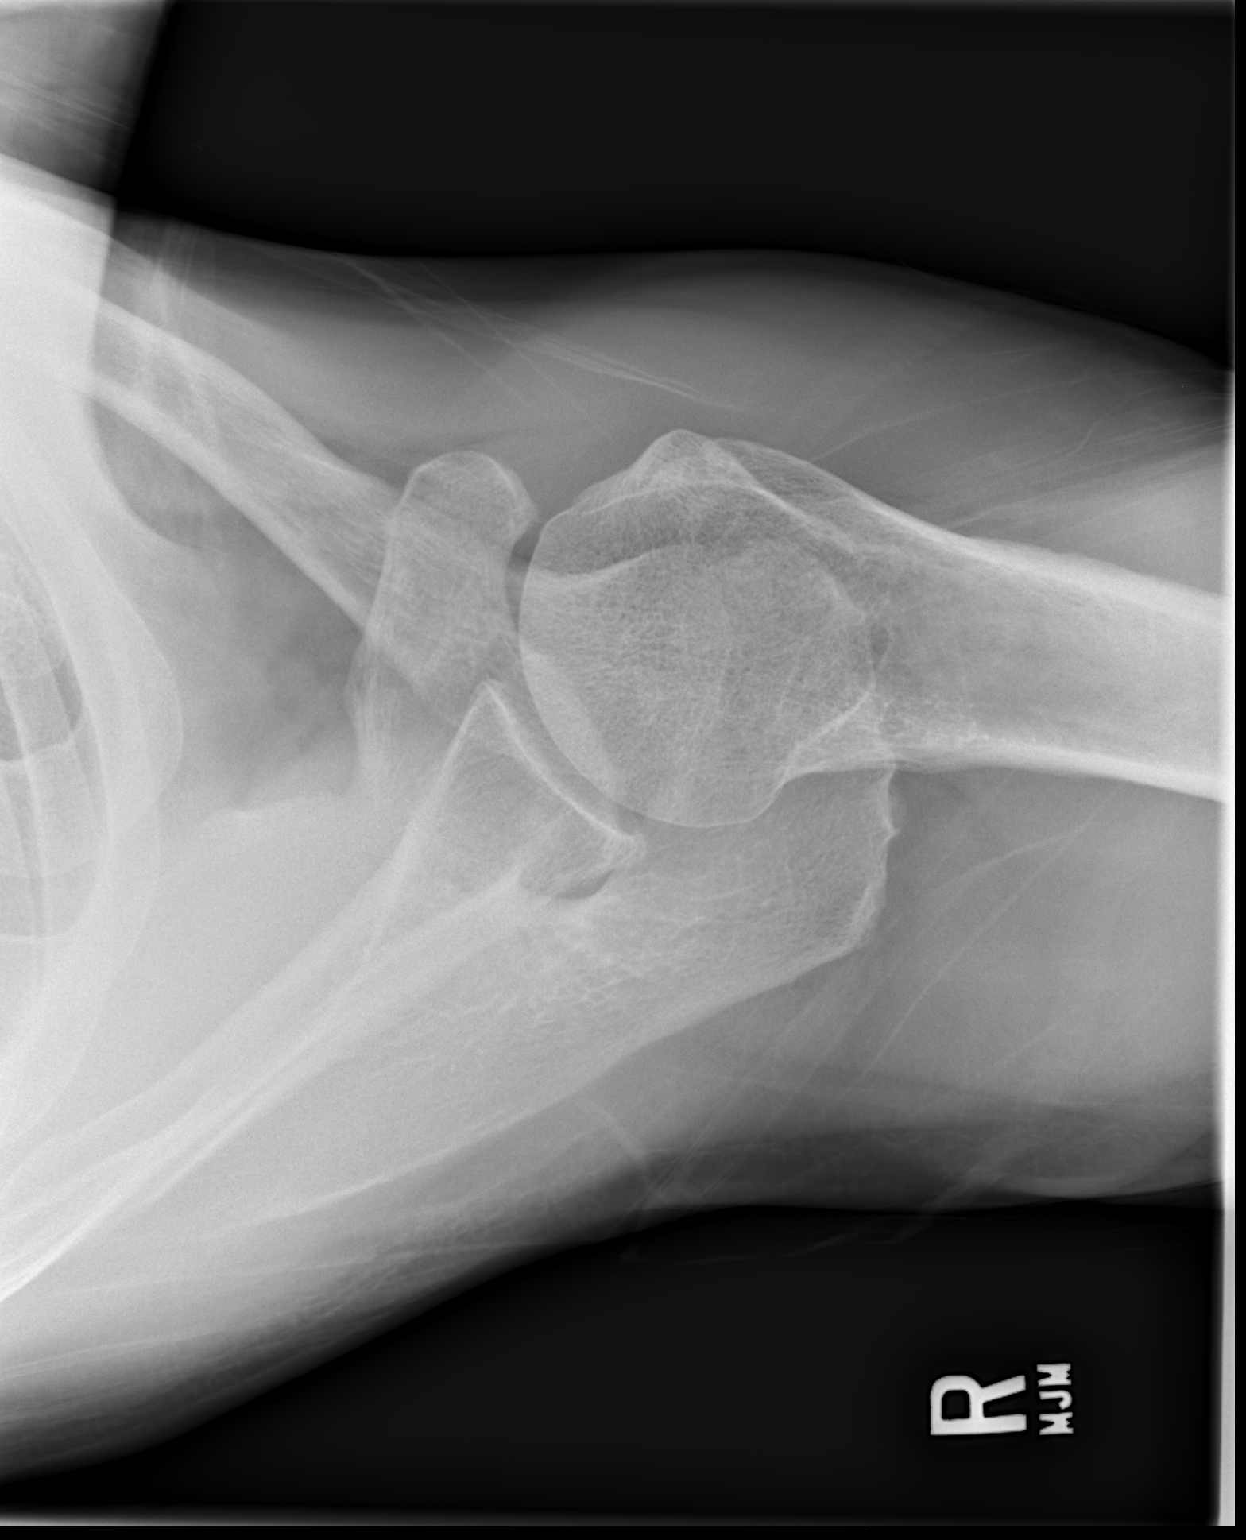

[3 of 3 positions shown; findings below may reference images not displayed]

FINDINGS: There is no evidence of fracture or dislocation. There is no
evidence of arthropathy or other focal bone abnormality. Soft
tissues are unremarkable.
IMPRESSION: Negative.

## 2019-03-19 ENCOUNTER — Ambulatory Visit: Payer: BLUE CROSS/BLUE SHIELD | Attending: Internal Medicine

## 2019-03-19 DIAGNOSIS — Z23 Encounter for immunization: Secondary | ICD-10-CM | POA: Insufficient documentation

## 2019-03-21 NOTE — Progress Notes (Signed)
   Covid-19 Vaccination Clinic  Name:  BILLEY WOJCIAK    MRN: [id number] DOB: 05-27-1950  03/19/2019  Mr. Blumenstock was observed post Covid-19 immunization for 15 minutes without incidence. He was provided with Vaccine Information Sheet and instruction to access the V-Safe system.   Mr. Schubring was instructed to call 911 with any severe reactions post vaccine: Marland Kitchen Difficulty breathing  . Swelling of your face and throat  . A fast heartbeat  . A bad rash all over your body  . Dizziness and weakness    Immunizations Administered    Name Date Dose VIS Date Route   Moderna COVID-19 Vaccine 03/19/2019  5:20 PM 0.5 mL 01/24/2019 Intramuscular   Manufacturer: Gala Murdoch   Lot: [id number]   NDC: 36681-594-70      Documented on behalf of:  Jonni Sanger, MD

## 2019-04-16 ENCOUNTER — Ambulatory Visit: Payer: Self-pay | Attending: Internal Medicine

## 2019-04-16 DIAGNOSIS — Z23 Encounter for immunization: Secondary | ICD-10-CM | POA: Insufficient documentation

## 2019-04-16 NOTE — Progress Notes (Signed)
   Covid-19 Vaccination Clinic  Name:  Cristian Tran    MRN: [id number] DOB: [birth year]  04/16/2019  [name] was observed post Covid-19 immunization for 15 minutes without incidence. He was provided with Vaccine Information Sheet and instruction to access the V-Safe system.   [name] was instructed to call 911 with any severe reactions post vaccine: Marland Kitchen Difficulty breathing  . Swelling of your face and throat  . A fast heartbeat  . A bad rash all over your body  . Dizziness and weakness    Immunizations Administered    Name Date Dose VIS Date Route   Moderna COVID-19 Vaccine 04/16/2019  4:49 PM 0.5 mL 01/24/2019 Intramuscular   Manufacturer: Moderna   Lot: [id number]   NDC: 69996-722-77

## 2021-07-23 ENCOUNTER — Ambulatory Visit (INDEPENDENT_AMBULATORY_CARE_PROVIDER_SITE_OTHER): Payer: 59 | Admitting: Medical

## 2021-07-23 ENCOUNTER — Encounter: Payer: Self-pay | Admitting: Medical

## 2021-07-23 VITALS — BP 128/78 | HR 60 | Ht 76.0 in | Wt 193.6 lb

## 2021-07-23 DIAGNOSIS — Z1329 Encounter for screening for other suspected endocrine disorder: Secondary | ICD-10-CM

## 2021-07-23 DIAGNOSIS — Z136 Encounter for screening for cardiovascular disorders: Secondary | ICD-10-CM | POA: Diagnosis not present

## 2021-07-23 DIAGNOSIS — G4700 Insomnia, unspecified: Secondary | ICD-10-CM

## 2021-07-23 DIAGNOSIS — R42 Dizziness and giddiness: Secondary | ICD-10-CM

## 2021-07-23 LAB — CBC WITH DIFFERENTIAL/PLATELET
Basos: 0 %
Hematocrit: 41.4 % (ref 37.5–51.0)
Hemoglobin: 14.3 g/dL (ref 13.0–17.7)
Immature Grans (Abs): 0 10*3/uL (ref 0.0–0.1)
Lymphocytes Absolute: 1.4 10*3/uL (ref 0.7–3.1)
MCH: 32.1 pg (ref 26.6–33.0)
MCHC: 34.5 g/dL (ref 31.5–35.7)
Monocytes: 12 %
Platelets: 192 10*3/uL (ref 150–450)

## 2021-07-23 LAB — COMPREHENSIVE METABOLIC PANEL

## 2021-07-23 NOTE — Progress Notes (Signed)
Subjective:  Cristian Tran is a 71 y.o. male who presents for Chief Complaint  Patient presents with   new pt    New pt get established. Had dizziness epsiode 2 weeks ago and called EMS and they said it could be dehyration and fluid in his ear, started drinking water and hasn't had it since. Having trouble sleeping. Will only sleep 2-3 hours at a time. Never had colonoscopy. Does not want pneumonia or shingles shot. Might get tetanus shot at local pharmacy     Here as a new patient to establish care.    He had been in normal state of health but 2 weeks ago had an episode of dizziness.   He was on his porch sitting in a chair.  He felt everything seemed to be spinning or felt dizzy.  Ended up calling EMS.  EMS felt he could have been dehydrated.  He said in recent weeks very busy, not sleeping a lot, maybe not drinking as much water .  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  Ended up going to urgent care same day, no labs done, EKG reviewed, there was question about murmur [facility]).   Works for exercise, does Aeronautical engineer, Brewing technologist.  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   Ins omnia-lately has had some trouble sleeping.  He does have a lot on his mind.  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  He still works part-time.  He does endorse looking on his phone close to bedtime and eating or drinking close to bedtime.  He does not drink a lot of water in general but lately he has tried to increase his water intake.  No other aggravating or relieving factors.    No other c/o.  No past medical history on file.  Current Outpatient Medications on File Prior to Visit  Medication Sig Dispense Refill   [DISCONTINUED] fluticasone (FLONASE) 50 MCG/ACT nasal spray Place 2 sprays into the nose daily. 16 g 2   No current facility-administered medications on file prior to visit.   The following  portions of the patient's history were reviewed and updated as appropriate: allergies, current medications, past family history, past medical history, past social history, past surgical history and problem list.  ROS Otherwise as in subjective above    Objective: BP 128/78   Pulse 60   Ht 6\' 4"  (1.93 m)   Wt 193 lb 9.6 oz (87.8 kg)   BMI 23.57 kg/m   General appearance: alert, no distress, well developed, well nourished, African American male HEENT: normocephalic, sclerae anicteric, conjunctiva pink and moist, TMs pearly, nares patent, no discharge or erythema, pharynx normal Oral cavity: MMM, no lesions Neck: supple, no lymphadenopathy, no thyromegaly, no masses, no bruits Heart: RRR, normal S1, S2, no murmurs Lungs: CTA bilaterally, no wheezes, rhonchi, or rales Abdomen: +bs, soft, non tender, non distended, no masses, no hepatomegaly, no splenomegaly, on bruits Pulses: 2+ radial pulses, 2+ pedal pulses, normal cap refill Ext: no edema   Assessment: Encounter Diagnoses  Name Primary?   Dizziness Yes   Lightheaded    Screening for heart disease    Screening for thyroid disorder    Insomnia, unspecified type      Plan: Very pleasant gentleman with a single recent episode of lightheadedness that was short-lived, had symptoms of vertigo.  He has not had routine  preventative care or wellness visits but he seems to have been healthy for a long time.  Non-smoker.  He is active.  EKG reviewed.  We discussed possible LVH findings on EKG  We will await labs  Consider baseline cardiology consult given the LVH notation on EKG and dizziness.  We discussed potential treatment for vertigo if he has more of a vertigo picture.  We discussed symptoms of vertigo versus lightheadedness or other types of dizziness.  We discussed sleep hygiene.  He can try Benadryl as needed but he does have some things he can work on lifestyle changes for insomnia.  Cristian Tran was seen today for new  pt.  Diagnoses and all orders for this visit:  Dizziness -     EKG 12-Lead -     Comprehensive metabolic panel -     CBC with Differential/Platelet -     TSH  Lightheaded -     EKG 12-Lead -     Comprehensive metabolic panel -     CBC with Differential/Platelet -     TSH  Screening for heart disease -     EKG 12-Lead  Screening for thyroid disorder -     TSH  Insomnia, unspecified type    Follow up: pending labs

## 2021-07-24 ENCOUNTER — Other Ambulatory Visit: Payer: Self-pay | Admitting: Internal Medicine

## 2021-07-24 DIAGNOSIS — R9431 Abnormal electrocardiogram [ECG] [EKG]: Secondary | ICD-10-CM

## 2021-07-24 LAB — COMPREHENSIVE METABOLIC PANEL
ALT: 16 IU/L (ref 0–44)
Albumin/Globulin Ratio: 1.9 (ref 1.2–2.2)
Alkaline Phosphatase: 90 IU/L (ref 44–121)
BUN/Creatinine Ratio: 15 (ref 10–24)
BUN: 16 mg/dL (ref 8–27)
CO2: 22 mmol/L (ref 20–29)
Chloride: 103 mmol/L (ref 96–106)
Creatinine, Ser: 1.09 mg/dL (ref 0.76–1.27)
Globulin, Total: 2.4 g/dL (ref 1.5–4.5)
Glucose: 88 mg/dL (ref 70–99)
Potassium: 4.7 mmol/L (ref 3.5–5.2)
eGFR: 74 mL/min/{1.73_m2} (ref >59)

## 2021-07-24 LAB — CBC WITH DIFFERENTIAL/PLATELET
Basophils Absolute: 0 10*3/uL (ref 0.0–0.2)
EOS (ABSOLUTE): 0.1 10*3/uL (ref 0.0–0.4)
Eos: 2 %
Immature Granulocytes: 0 %
Lymphs: 46 %
MCV: 93 fL (ref 79–97)
Monocytes Absolute: 0.4 10*3/uL (ref 0.1–0.9)
Neutrophils Absolute: 1.3 10*3/uL — ABNORMAL LOW (ref 1.4–7.0)
Neutrophils: 40 %
RBC: 4.46 x10E6/uL (ref 4.14–5.80)
RDW: 13 % (ref 11.6–15.4)
WBC: 3.1 10*3/uL — ABNORMAL LOW (ref 3.4–10.8)

## 2021-07-24 LAB — TSH: TSH: 1.33 u[IU]/mL (ref 0.450–4.500)

## 2021-08-12 ENCOUNTER — Encounter: Payer: Self-pay | Admitting: *Deleted

## 2021-08-23 HISTORY — PX: NO PAST SURGERIES: SHX2092

## 2021-09-15 ENCOUNTER — Encounter: Payer: Self-pay | Admitting: Medical

## 2021-09-15 ENCOUNTER — Ambulatory Visit (INDEPENDENT_AMBULATORY_CARE_PROVIDER_SITE_OTHER): Payer: 59 | Admitting: Medical

## 2021-09-15 VITALS — BP 130/80 | HR 52 | Ht 75.0 in | Wt 198.0 lb

## 2021-09-15 DIAGNOSIS — M79672 Pain in left foot: Secondary | ICD-10-CM

## 2021-09-15 DIAGNOSIS — Z1211 Encounter for screening for malignant neoplasm of colon: Secondary | ICD-10-CM | POA: Insufficient documentation

## 2021-09-15 DIAGNOSIS — Z23 Encounter for immunization: Secondary | ICD-10-CM

## 2021-09-15 DIAGNOSIS — Z0000 Encounter for general adult medical examination without abnormal findings: Secondary | ICD-10-CM | POA: Diagnosis not present

## 2021-09-15 DIAGNOSIS — Z125 Encounter for screening for malignant neoplasm of prostate: Secondary | ICD-10-CM

## 2021-09-15 DIAGNOSIS — Z13220 Encounter for screening for lipoid disorders: Secondary | ICD-10-CM

## 2021-09-15 DIAGNOSIS — R202 Paresthesia of skin: Secondary | ICD-10-CM | POA: Insufficient documentation

## 2021-09-15 DIAGNOSIS — R011 Cardiac murmur, unspecified: Secondary | ICD-10-CM

## 2021-09-15 DIAGNOSIS — Z131 Encounter for screening for diabetes mellitus: Secondary | ICD-10-CM

## 2021-09-15 DIAGNOSIS — M79671 Pain in right foot: Secondary | ICD-10-CM

## 2021-09-15 LAB — POCT URINALYSIS DIP (PROADVANTAGE DEVICE)
Bilirubin, UA: NEGATIVE
Blood, UA: NEGATIVE
Glucose, UA: NEGATIVE mg/dL
Ketones, POC UA: NEGATIVE mg/dL
Leukocytes, UA: NEGATIVE
Nitrite, UA: NEGATIVE
Protein Ur, POC: NEGATIVE mg/dL
Specific Gravity, Urine: 1.01
Urobilinogen, Ur: 0.2
pH, UA: 6 (ref 5.0–8.0)

## 2021-09-15 NOTE — Patient Instructions (Signed)
[facility] Address: [address], [place], [place] [address] Phone: [phone]  Optometrists nearby  [facility] [address] Mervyn Skeeters [place], [place] [address] [phone]  Dr. Corky Mull [address] [place], [place] [address] [phone]  [facility] [address] Leonard Schwartz [place], [place] [address] [phone]  [facility], Dr. Shea Evans [address] [place], [place] [address] [phone]

## 2021-09-15 NOTE — Progress Notes (Signed)
Subjective:   HPI  Cristian Tran is a 71 y.o. male who presents for Chief Complaint  Patient presents with   Annual Exam    CPE- Pt had labs down this morning.    Patient Care Team: Lova Urbieta, Cleda Mccreedy as PCP - General (Family Medicine) Sees dentist  Concerns: Feet get tingly at times in bottom of feet and some discomfort in bilat ball of feet  Reviewed their medical, surgical, family, social, medication, and allergy history and updated chart as appropriate.  History reviewed. No pertinent past medical history.  Past Surgical History:  Procedure Laterality Date   NO PAST SURGERIES  08/2021    History reviewed. No pertinent family history.  No current outpatient medications on file.  No Known Allergies   Review of Systems Constitutional: -fever, -chills, -sweats, -unexpected weight change, -decreased appetite, -fatigue Allergy: -sneezing, -itching, -congestion Dermatology: -changing moles, --rash, -lumps ENT: -runny nose, -ear pain, -sore throat, -hoarseness, -sinus pain, -teeth pain, - ringing in ears, -hearing loss, -nosebleeds Cardiology: -chest pain, -palpitations, -swelling, -difficulty breathing when lying flat, -waking up short of breath Respiratory: -cough, -shortness of breath, -difficulty breathing with exercise or exertion, -wheezing, -coughing up blood Gastroenterology: -abdominal pain, -nausea, -vomiting, -diarrhea, -constipation, -blood in stool, -changes in bowel movement, -difficulty swallowing or eating Hematology: -bleeding, -bruising  Musculoskeletal: -joint aches, -muscle aches, -joint swelling, -back pain, -neck pain, -cramping, -changes in gait Ophthalmology: denies vision changes, eye redness, itching, discharge Urology: -burning with urination, -difficulty urinating, -blood in urine, -urinary frequency, -urgency, -incontinence Neurology: -headache, -weakness, -tingling, -numbness, -memory loss, -falls, -dizziness Psychology: -depressed  mood, -agitation, -sleep problems Male GU: no testicular mass, pain, no lymph nodes swollen, no swelling, no rash.     09/15/2021   11:06 AM 07/23/2021    9:29 AM  Depression screen PHQ 2/9  Decreased Interest 0 0  Down, Depressed, Hopeless 0 0  PHQ - 2 Score 0 0        Objective:  BP 130/80   Pulse (!) 52   Ht 6\' 3"  (1.905 m)   Wt 198 lb (89.8 kg)   SpO2 98%   BMI 24.75 kg/m   General appearance: alert, no distress, WD/WN, African American male Skin: unremarkable, thickened bilat toenails, but otherwise unremarkable HEENT: normocephalic, conjunctiva/corneas normal, sclerae anicteric, PERRLA, EOMi, nares patent, no discharge or erythema, pharynx normal Neck: supple, no lymphadenopathy, no thyromegaly, no masses, normal ROM, no bruits Chest: non tender, normal shape and expansion Heart: +2/6 brief murmur in upper borders, otherwise RRR, normal S1, S2 Lungs: CTA bilaterally, no wheezes, rhonchi, or rales Abdomen: +bs, soft, non tender, non distended, no masses, no hepatomegaly, no splenomegaly, no bruits Back: non tender, normal ROM, no scoliosis Musculoskeletal: upper extremities non tender, no obvious deformity, normal ROM throughout, lower extremities non tender, no obvious deformity, normal ROM throughout Extremities: no edema, no cyanosis, no clubbing Pulses: 2+ symmetric, upper and lower extremities, normal cap refill Neurological: alert, oriented x 3, CN2-12 intact, strength normal upper extremities and lower extremities, sensation normal throughout, DTRs 2+ throughout, no cerebellar signs, gait normal Psychiatric: normal affect, behavior normal, pleasant  GU: normal male external genitalia,circumcised, nontender, no masses, no hernia, no lymphadenopathy Rectal: anus normal tone, prostate WNL   Assessment and Plan :   Encounter Diagnoses  Name Primary?   Encounter for health maintenance examination in adult Yes   Need for Tdap vaccination    Screen for colon cancer     Screening for prostate cancer  Pain in both feet    Screening for lipid disorders    Paresthesia    Screening for diabetes mellitus    Heart murmur     This visit was a preventative care visit, also known as wellness visit or routine physical.   Topics typically include healthy lifestyle, diet, exercise, preventative care, vaccinations, sick and well care, proper use of emergency dept and after hours care, as well as other concerns.     Recommendations: Continue to return yearly for your annual wellness and preventative care visits.  This gives [place] a chance to discuss healthy lifestyle, exercise, vaccinations, review your chart record, and perform screenings where appropriate.  I recommend you see your eye doctor yearly for routine vision care.  I recommend you see your dentist yearly for routine dental care including hygiene visits twice yearly.   Vaccination recommendations were reviewed Immunization History  Administered Date(s) Administered   [employer] Vaccination 03/19/2019, 04/16/2019   Tdap 09/15/2021    Shingles vaccine:  I recommend you have a shingles vaccine to help prevent shingles or herpes zoster outbreak.   Please call your insurer to inquire about coverage for the Shingrix vaccine given in 2 doses.   Some insurers cover this vaccine after age 44, some cover this after age 85.  If your insurer covers this, then call to schedule appointment to have this vaccine here.  Consider a one time Prevnar 20 pneumococcal vaccine  We updated tdap tetanus booster today  Counseled on the Tdap (tetanus, diptheria, and acellular pertussis) vaccine.  Vaccine information sheet given. Tdap vaccine given after consent obtained.     Screening for cancer: Colon cancer screening: We discussed options for colon cancer screening. We will refer you for Cologard   We discussed PSA, prostate exam, and prostate cancer screening risks/benefits.     Skin cancer  screening: Check your skin regularly for new changes, growing lesions, or other lesions of concern Come in for evaluation if you have skin lesions of concern.  Lung cancer screening: If you have a greater than 20 pack year history of tobacco use, then you may qualify for lung cancer screening with a chest CT scan.   Please call your insurance company to inquire about coverage for this test.  We currently don't have screenings for other cancers besides breast, cervical, colon, and lung cancers.  If you have a strong family history of cancer or have other cancer screening concerns, please let me know.    Bone health: Get at least 150 minutes of aerobic exercise weekly Get weight bearing exercise at least once weekly Bone density test:  A bone density test is an imaging test that uses a type of X-ray to measure the amount of calcium and other minerals in your bones. The test may be used to diagnose or screen you for a condition that causes weak or thin bones (osteoporosis), predict your risk for a broken bone (fracture), or determine how well your osteoporosis treatment is working. The bone density test is recommended for females 65 and older, or females or males <65 if certain risk factors such as thyroid disease, long term use of steroids such as for asthma or rheumatological issues, vitamin D deficiency, estrogen deficiency, family history of osteoporosis, self or family history of fragility fracture in first degree relative.    Heart health: Get at least 150 minutes of aerobic exercise weekly Limit alcohol It is important to maintain a healthy blood pressure and healthy cholesterol numbers  Heart  disease screening: Screening for heart disease includes screening for blood pressure, fasting lipids, glucose/diabetes screening, BMI height to weight ratio, reviewed of smoking status, physical activity, and diet.    Goals include blood pressure 120/80 or less, maintaining a healthy  lipid/cholesterol profile, preventing diabetes or keeping diabetes numbers under good control, not smoking or using tobacco products, exercising most days per week or at least 150 minutes per week of exercise, and eating healthy variety of fruits and vegetables, healthy oils, and avoiding unhealthy food choices like fried food, fast food, high sugar and high cholesterol foods.     Continue plan to see cardiology soon   Medical care options: I recommend you continue to seek care here first for routine care.  We try really hard to have available appointments Monday through Friday daytime hours for sick visits, acute visits, and physicals.  Urgent care should be used for after hours and weekends for significant issues that cannot wait till the next day.  The emergency department should be used for significant potentially life-threatening emergencies.  The emergency department is expensive, can often have long wait times for less significant concerns, so try to utilize primary care, urgent care, or telemedicine when possible to avoid unnecessary trips to the emergency department.  Virtual visits and telemedicine have been introduced since the pandemic started in 2020, and can be convenient ways to receive medical care.  We offer virtual appointments as well to assist you in a variety of options to seek medical care.   Advanced Directives: I recommend you consider completing a Health Care Power of Attorney and Living Will.   These documents respect your wishes and help alleviate burdens on your loved ones if you were to become terminally ill or be in a position to need those documents enforced.    You can complete Advanced Directives yourself, have them notarized, then have copies made for our office, for you and for anybody you feel should have them in safe keeping.  Or, you can have an attorney prepare these documents.   If you haven't updated your Last Will and Testament in a while, it may be worthwhile  having an attorney prepare these documents together and save on some costs.       Separate significant issues discussed: Foot pain and paresthesias-labs today, may just be some metatarsalgia.  Consider podiatry consult if needed.  He came in a few months ago for dizziness.  We initially referred to cardiology and he had canceled for the time being.  New referral today particularly in light of the new murmur heard   Jamier was seen today for annual exam.  Diagnoses and all orders for this visit:  Encounter for health maintenance examination in adult -     Cologuard -     PSA -     Lipid panel -     Vitamin B12 -     POCT Urinalysis DIP (Proadvantage Device) -     HIV Antibody (routine testing w rflx) -     Hepatitis C antibody -     Hemoglobin A1c -     RPR  Need for Tdap vaccination -     Tdap vaccine greater than or equal to 7yo IM  Screen for colon cancer -     Cologuard  Screening for prostate cancer -     PSA  Pain in both feet  Screening for lipid disorders -     Lipid panel  Paresthesia -  Vitamin B12  Screening for diabetes mellitus -     Hemoglobin A1c  Heart murmur -     Ambulatory referral to Cardiology    follow-up pending labs, yearly for physical

## 2021-09-17 LAB — PSA

## 2021-09-18 LAB — LIPID PANEL
Chol/HDL Ratio: 3.5 ratio (ref 0.0–5.0)
Cholesterol, Total: 247 mg/dL — ABNORMAL HIGH (ref 100–199)
HDL: 71 mg/dL (ref >39)
LDL Chol Calc (NIH): 166 mg/dL — ABNORMAL HIGH (ref 0–99)
Triglycerides: 62 mg/dL (ref 0–149)
VLDL Cholesterol Cal: 10 mg/dL (ref 5–40)

## 2021-09-18 LAB — HEMOGLOBIN A1C
Est. average glucose Bld gHb Est-mCnc: 117 mg/dL
Hgb A1c MFr Bld: 5.7 % — ABNORMAL HIGH (ref 4.8–5.6)

## 2021-09-18 LAB — VITAMIN B12: Vitamin B-12: 327 pg/mL (ref 232–1245)

## 2021-09-18 LAB — RPR: RPR Ser Ql: NONREACTIVE

## 2021-09-18 LAB — HIV ANTIBODY (ROUTINE TESTING W REFLEX): HIV Screen 4th Generation wRfx: NONREACTIVE

## 2021-09-18 LAB — HEPATITIS C ANTIBODY: Hep C Virus Ab: NONREACTIVE

## 2021-10-06 ENCOUNTER — Ambulatory Visit: Payer: Medicare (Managed Care) | Admitting: Cardiology

## 2021-10-06 NOTE — Progress Notes (Deleted)
ID:  Cristian Tran, DOB 1950-11-24, MRN [id number]  PCP:  Jac Canavan, PA-C  Cardiologist:  Tessa Lerner, DO, [facility] (established care 10/06/2021) Former Cardiology Providers: ***  REASON FOR CONSULT: Heart murmur  REQUESTING PHYSICIAN:  Jac Canavan, PA-C [address] [place],  [place] [address]  No chief complaint on file.   HPI  Cristian Tran is a 71 y.o. African-American male who presents to the clinic for evaluation of *** at the request of Genia Del. His past medical history and cardiovascular risk factors include: Former smoker, hyperlipidemia, ***  ***  History of  Denies prior history of coronary artery disease, myocardial infarction, congestive heart failure, deep venous thrombosis, pulmonary embolism, stroke, transient ischemic attack.  FUNCTIONAL STATUS: ***   ALLERGIES: No Known Allergies  MEDICATION LIST PRIOR TO VISIT: No outpatient medications have been marked as taking for the 10/06/21 encounter (Appointment) with Tessa Lerner, DO.     PAST MEDICAL HISTORY: No past medical history on file.  PAST SURGICAL HISTORY: Past Surgical History:  Procedure Laterality Date   NO PAST SURGERIES  08/2021    FAMILY HISTORY: The patient family history is not on file.  SOCIAL HISTORY:  The patient  reports that he has never smoked. He does not have any smokeless tobacco history on file. He reports current alcohol use. He reports that he does not use drugs.  REVIEW OF SYSTEMS: ROS  PHYSICAL EXAM:    09/15/2021   11:04 AM 07/23/2021    9:32 AM 08/01/2016    6:30 AM  Vitals with BMI  Height 6\' 3"  6\' 4"    Weight 198 lbs 193 lbs 10 oz   BMI 24.75 23.58   Systolic 130 128  Diastolic 80 78 90  Pulse 52 60 62    CONSTITUTIONAL: Well-developed and well-nourished. No acute distress.  SKIN: Skin is warm and dry. No rash noted. No cyanosis. No pallor. No jaundice HEAD: Normocephalic and atraumatic.  EYES: No scleral  icterus MOUTH/THROAT: Moist oral membranes.  NECK: No JVD present. No thyromegaly noted. No carotid bruits  LYMPHATIC: No visible cervical adenopathy.  CHEST Normal respiratory effort. No intercostal retractions  LUNGS: ***Clear to auscultation bilaterally.  No stridor. No wheezes. No rales.  CARDIOVASCULAR: ***Regular rate and rhythm, positive S1-S2, no murmurs rubs or gallops appreciated. ABDOMINAL: *** No apparent ascites.  EXTREMITIES: No peripheral edema, warm to touch, ***DP and PT pulses HEMATOLOGIC: No significant bruising NEUROLOGIC: Oriented to person, place, and time. Nonfocal. Normal muscle tone.  PSYCHIATRIC: Normal mood and affect. Normal behavior. Cooperative  CARDIAC DATABASE: EKG: ***  Echocardiogram: No results found for this or any previous visit from the past 1095 days.    Stress Testing: No results found for this or any previous visit from the past 1095 days.   Heart Catheterization: None  LABORATORY DATA:    Latest Ref Rng & Units 07/23/2021   10:04 AM  CBC  WBC 3.4 - 10.8 x10E3/uL 3.1   Hemoglobin 13.0 - 17.7 g/dL 803   Hematocrit 07/25/2021 - 51.0 % 41.4   Platelets 150 - 450 x10E3/uL 192        Latest Ref Rng & Units 07/23/2021   10:04 AM  CMP  Glucose 70 - 99 mg/dL 88   BUN 8 - 27 mg/dL 16   Creatinine 24.8 - 1.27 mg/dL 07/25/2021   Sodium 2.50 - 0.37 mmol/L 141   Potassium 3.5 - 5.2 mmol/L 4.7   Chloride 96 - 106  mmol/L 103   CO2 20 - 29 mmol/L 22   Calcium 8.6 - 10.2 mg/dL 9.5   Total Protein 6.0 - 8.5 g/dL 6.9   Total Bilirubin 0.0 - 1.2 mg/dL 0.4   Alkaline Phos 44 - 121 IU/L 90   AST 0 - 40 IU/L 16   ALT 0 - 44 IU/L 16     Lipid Panel     Component Value Date/Time   CHOL 247 (H) 09/15/2021 1137   TRIG 62 09/15/2021 1137   HDL 71 09/15/2021 1137   CHOLHDL 3.5 09/15/2021 1137   LDLCALC 166 (H) 09/15/2021 1137   LABVLDL 10 09/15/2021 1137    No components found for: "NTPROBNP" No results for input(s): "PROBNP" in the last 8760  hours. Recent Labs    07/23/21 1004  TSH 1.330    BMP Recent Labs    07/23/21 1004  NA 141  K 4.7  CL 103  CO2 22  GLUCOSE 88  BUN 16  CREATININE 1.09  CALCIUM 9.5    HEMOGLOBIN A1C Lab Results  Component Value Date   HGBA1C 5.7 (H) 09/15/2021    IMPRESSION:  No diagnosis found.   RECOMMENDATIONS: Cristian Tran is a 71 y.o. *** male whose past medical history and cardiac risk factors include: ***   FINAL MEDICATION LIST END OF ENCOUNTER: No orders of the defined types were placed in this encounter.   There are no discontinued medications.  No current outpatient medications on file.  No orders of the defined types were placed in this encounter.   There are no Patient Instructions on file for this visit.   --Continue cardiac medications as reconciled in final medication list. --No follow-ups on file. or sooner if needed. --Continue follow-up with your primary care physician regarding the management of your other chronic comorbid conditions.  Patient's questions and concerns were addressed to his satisfaction. He voices understanding of the instructions provided during this encounter.   This note was created using a voice recognition software as a result there may be grammatical errors inadvertently enclosed that do not reflect the nature of this encounter. Every attempt is made to correct such errors.  Tessa Lerner, [place], [facility]  Pager: [phone] Office: [phone]

## 2021-10-08 ENCOUNTER — Other Ambulatory Visit: Payer: Self-pay | Admitting: Internal Medicine

## 2021-10-08 DIAGNOSIS — R972 Elevated prostate specific antigen [PSA]: Secondary | ICD-10-CM

## 2021-10-21 ENCOUNTER — Ambulatory Visit: Payer: Medicare (Managed Care) | Admitting: Cardiology

## 2021-10-21 ENCOUNTER — Encounter: Payer: Self-pay | Admitting: Cardiology

## 2021-10-21 VITALS — BP 137/80 | HR 64 | Temp 98.0°F | Resp 16 | Ht 75.0 in | Wt 189.0 lb

## 2021-10-21 DIAGNOSIS — R011 Cardiac murmur, unspecified: Secondary | ICD-10-CM

## 2021-10-21 DIAGNOSIS — E7800 Pure hypercholesterolemia, unspecified: Secondary | ICD-10-CM

## 2021-10-21 DIAGNOSIS — R7303 Prediabetes: Secondary | ICD-10-CM

## 2021-10-21 NOTE — Progress Notes (Signed)
ID:  Cristian Tran, DOB 15-Sep-1950, MRN [id number]  PCP:  Jac Canavan, PA-C  Cardiologist:  Tessa Lerner, DO, [facility] (established care 10/21/2021)  REASON FOR CONSULT: Heart murmur  REQUESTING PHYSICIAN:  Jac Canavan, PA-C [address] [place],  [place] [address]  Chief Complaint  Patient presents with   Heart Murmur   New Patient (Initial Visit)    HPI  Cristian Tran is a 71 y.o. African-American male who presents to the clinic for evaluation of heart murmur at the request of Genia Del. His past medical history and cardiovascular risk factors include: Hyperlipidemia, prediabetes.  Patient was referred to the practice for evaluation of a cardiac murmur at the request of PCP.  Otherwise he is asymptomatic.  No chest pain or heart failure symptoms.  No prior cardiovascular history.  Patient states that his wife is encouraging him to establish care with cardiology given his age.  He does landscaping on a regular basis and walks approximately 5 miles at least once a week and also uses a push mower during the summer months if and when possible to maintain his yards.   ALLERGIES: No Known Allergies  MEDICATION LIST PRIOR TO VISIT: No outpatient medications have been marked as taking for the 10/21/21 encounter (Office Visit) with Tessa Lerner, DO.     PAST MEDICAL HISTORY: Past Medical History:  Diagnosis Date   Hyperlipidemia    Prediabetes     PAST SURGICAL HISTORY: Past Surgical History:  Procedure Laterality Date   NO PAST SURGERIES  08/2021    FAMILY HISTORY: The patient family history includes Heart attack in his brother; Hypertension in his mother.  SOCIAL HISTORY:  The patient  reports that he has never smoked. He has never used smokeless tobacco. He reports current alcohol use. He reports that he does not use drugs.  REVIEW OF SYSTEMS: Review of Systems  Cardiovascular:  Negative for chest pain, claudication, dyspnea on exertion,  irregular heartbeat, leg swelling, near-syncope, orthopnea, palpitations, paroxysmal nocturnal dyspnea and syncope.  Respiratory:  Negative for shortness of breath.   Hematologic/Lymphatic: Negative for bleeding problem.  Musculoskeletal:  Negative for muscle cramps and myalgias.  Neurological:  Negative for dizziness and light-headedness.   PHYSICAL EXAM:    10/21/2021    9:28 AM 09/15/2021   11:04 AM 07/23/2021    9:32 AM  Vitals with BMI  Height 6\' 3"  6\' 3"  6\' 4"   Weight 189 lbs 198 lbs 193 lbs 10 oz  BMI 23.62 24.75 23.58  Systolic 137 130  Diastolic 80 80 78  Pulse 64 52 60    Physical Exam  Constitutional: No distress.  Age appropriate, hemodynamically stable.   Neck: No JVD present.  Cardiovascular: Normal rate, regular rhythm, S1 normal, S2 normal, intact distal pulses and normal pulses. Exam reveals no gallop, no S3 and no S4.  Murmur heard. Holosystolic murmur is present with a grade of 3/6 at the apex. Pulses:      Dorsalis pedis pulses are 2+ on the right side and 2+ on the left side.       Posterior tibial pulses are 2+ on the right side and 2+ on the left side.  Pulmonary/Chest: Effort normal and breath sounds normal. No stridor. He has no wheezes. He has no rales.  Abdominal: Soft. Bowel sounds are normal. He exhibits no distension. There is no abdominal tenderness.  Musculoskeletal:        General: No edema.  Cervical back: Neck supple.  Neurological: He is alert and oriented to person, place, and time. He has intact cranial nerves (2-12).  Skin: Skin is warm and moist.   CARDIAC DATABASE: EKG: 10/21/21: Sinus bradycardia, 53 bpm, LVH per voltage criteria, without underlying injury pattern.  Echocardiogram: No results found for this or any previous visit from the past 1095 days.    Stress Testing: No results found for this or any previous visit from the past 1095 days.   Heart Catheterization: None  LABORATORY DATA:    Latest Ref Rng & Units  07/23/2021   10:04 AM  CBC  WBC 3.4 - 10.8 x10E3/uL 3.1   Hemoglobin 13.0 - 17.7 g/dL 16.1   Hematocrit 09.6 - 51.0 % 41.4   Platelets 150 - 450 x10E3/uL 192        Latest Ref Rng & Units 07/23/2021   10:04 AM  CMP  Glucose 70 - 99 mg/dL 88   BUN 8 - 27 mg/dL 16   Creatinine 0.45 - 1.27 mg/dL 4.09   Sodium 811 - 914 mmol/L 141   Potassium 3.5 - 5.2 mmol/L 4.7   Chloride 96 - 106 mmol/L 103   CO2 20 - 29 mmol/L 22   Calcium 8.6 - 10.2 mg/dL 9.5   Total Protein 6.0 - 8.5 g/dL 6.9   Total Bilirubin 0.0 - 1.2 mg/dL 0.4   Alkaline Phos 44 - 121 IU/L 90   AST 0 - 40 IU/L 16   ALT 0 - 44 IU/L 16     Lipid Panel     Component Value Date/Time   CHOL 247 (H) 09/15/2021 1137   TRIG 62 09/15/2021 1137   HDL 71 09/15/2021 1137   CHOLHDL 3.5 09/15/2021 1137   LDLCALC 166 (H) 09/15/2021 1137   LABVLDL 10 09/15/2021 1137    No components found for: "NTPROBNP" No results for input(s): "PROBNP" in the last 8760 hours. Recent Labs    07/23/21 1004  TSH 1.330    BMP Recent Labs    07/23/21 1004  NA 141  K 4.7  CL 103  CO2 22  GLUCOSE 88  BUN 16  CREATININE 1.09  CALCIUM 9.5    HEMOGLOBIN A1C Lab Results  Component Value Date   HGBA1C 5.7 (H) 09/15/2021    IMPRESSION:    ICD-10-CM   1. Heart murmur  R01.1 EKG 12-Lead    PCV ECHOCARDIOGRAM COMPLETE    2. Pure hypercholesterolemia  E78.00     3. Prediabetes  R73.03        RECOMMENDATIONS: Cristian Tran is a 71 y.o. African-American male whose past medical history and cardiac risk factors include: Hypercholesterolemia, prediabetes.  Patient presents to the office for evaluation of a cardiac murmur at the request of PCP.  Currently he is asymptomatic and denies anginal discomfort, heart failure symptoms, or near syncope/syncope.  He was recently screened for diabetes as well as hyperlipidemia during his recent physical.  His lipid profile independently reviewed which notes uncontrolled LDL levels.  He would  like to focus on lifestyle changes and then consider pharmacological therapy if needed.  He would like to follow-up with PCP with this regard.  He is also noted to be prediabetic I have educated him on the importance of glycemic control.  Patient has a very faint systolic murmur and also LVH per voltage criteria on the surface ECG.  We will start the cardiovascular work-up with a surface echocardiogram to evaluate for structural heart disease.  Patient is asked to increase his physical activity as tolerated with a goal of 30 minutes a day 5 days a week.  FINAL MEDICATION LIST END OF ENCOUNTER: No orders of the defined types were placed in this encounter.   There are no discontinued medications.  No current outpatient medications on file.  Orders Placed This Encounter  Procedures   EKG 12-Lead   PCV ECHOCARDIOGRAM COMPLETE    There are no Patient Instructions on file for this visit.   --Continue cardiac medications as reconciled in final medication list. --Return in about 6 weeks (around 12/02/2021). or sooner if needed. --Continue follow-up with your primary care physician regarding the management of your other chronic comorbid conditions.  Patient's questions and concerns were addressed to his satisfaction. He voices understanding of the instructions provided during this encounter.   This note was created using a voice recognition software as a result there may be grammatical errors inadvertently enclosed that do not reflect the nature of this encounter. Every attempt is made to correct such errors.  Tessa Lerner, [place], [facility]  Pager: [phone] Office: [phone]

## 2021-11-21 ENCOUNTER — Ambulatory Visit: Payer: Medicare (Managed Care)

## 2021-11-21 DIAGNOSIS — I341 Nonrheumatic mitral (valve) prolapse: Secondary | ICD-10-CM

## 2021-11-21 DIAGNOSIS — R011 Cardiac murmur, unspecified: Secondary | ICD-10-CM

## 2021-12-08 ENCOUNTER — Ambulatory Visit: Payer: Medicare (Managed Care) | Admitting: Cardiology

## 2021-12-08 NOTE — Progress Notes (Signed)
Called pt no answer, left a vm

## 2021-12-09 ENCOUNTER — Encounter: Payer: Self-pay | Admitting: Cardiology

## 2021-12-09 ENCOUNTER — Ambulatory Visit: Payer: Medicare (Managed Care) | Admitting: Cardiology

## 2021-12-09 VITALS — BP 144/89 | HR 79 | Temp 97.8°F | Resp 16 | Ht 75.0 in | Wt 192.0 lb

## 2021-12-09 DIAGNOSIS — R7303 Prediabetes: Secondary | ICD-10-CM

## 2021-12-09 DIAGNOSIS — I351 Nonrheumatic aortic (valve) insufficiency: Secondary | ICD-10-CM

## 2021-12-09 DIAGNOSIS — E7800 Pure hypercholesterolemia, unspecified: Secondary | ICD-10-CM

## 2021-12-09 DIAGNOSIS — I340 Nonrheumatic mitral (valve) insufficiency: Secondary | ICD-10-CM

## 2021-12-09 MED ORDER — LOSARTAN POTASSIUM 25 MG PO TABS: 25.0000 mg | ORAL_TABLET | ORAL | 0 refills | Status: DC

## 2021-12-09 NOTE — Progress Notes (Signed)
ID:  SHEEHAN STACEY, DOB 06-20-1950, MRN [id number]  PCP:  Jac Canavan, PA-C  Cardiologist:  Tessa Lerner, DO, [facility] (established care 10/21/2021)  Date: 12/09/21 Last Office Visit: 10/21/2021  Chief Complaint  Patient presents with   Heart Murmur   Follow-up    6 week    HPI  Cristian Tran is a 71 y.o. African-American male whose past medical history and cardiovascular risk factors include: Mitral and aortic regurgitation, hyperlipidemia, prediabetes.  Patient was referred to the practice for evaluation of a cardiac murmur.  On auscultation he did have signs of mitral regurgitation.  His last office visit an echocardiogram was performed which shows MVP on 2D echo with mild to moderate on color Doppler.  Since last office visit he denies anginal discomfort or heart failure symptoms.  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  He walks approximately 5 miles at least once a week and uses a push mower during the summer months to maintain his yard.   ALLERGIES: No Known Allergies  MEDICATION LIST PRIOR TO VISIT: Current Meds  Medication Sig   losartan (COZAAR) 25 MG tablet Take 1 tablet (25 mg total) by mouth every morning.     PAST MEDICAL HISTORY: Past Medical History:  Diagnosis Date   Hyperlipidemia    Mitral regurgitation    Prediabetes     PAST SURGICAL HISTORY: Past Surgical History:  Procedure Laterality Date   NO PAST SURGERIES  08/2021    FAMILY HISTORY: The patient family history includes Heart attack in his brother; Hypertension in his mother.  SOCIAL HISTORY:  The patient  reports that he has never smoked. He has never used smokeless tobacco. He reports current alcohol use. He reports that he does not use drugs.  REVIEW OF SYSTEMS: Review of Systems  Cardiovascular:  Negative for chest pain, claudication, dyspnea on exertion, irregular heartbeat, leg swelling, near-syncope, orthopnea, palpitations, paroxysmal nocturnal  dyspnea and syncope.  Respiratory:  Negative for shortness of breath.   Hematologic/Lymphatic: Negative for bleeding problem.  Musculoskeletal:  Negative for muscle cramps and myalgias.  Neurological:  Negative for dizziness and light-headedness.   PHYSICAL EXAM:    12/09/2021   10:02 AM 10/21/2021    9:28 AM 09/15/2021   11:04 AM  Vitals with BMI  Height 6\' 3"  6\' 3"  6\' 3"   Weight 192 lbs 189 lbs 198 lbs  BMI 24 23.62 24.75  Systolic 144 137  Diastolic 89 80 80  Pulse 79 64 52    Physical Exam  Constitutional: No distress.  Age appropriate, hemodynamically stable.   Neck: No JVD present.  Cardiovascular: Normal rate, regular rhythm, S1 normal, S2 normal, intact distal pulses and normal pulses. Exam reveals no gallop, no S3 and no S4.  Murmur heard. Holosystolic murmur is present with a grade of 3/6 at the apex. Pulses:      Dorsalis pedis pulses are 2+ on the right side and 2+ on the left side.       Posterior tibial pulses are 2+ on the right side and 2+ on the left side.  Pulmonary/Chest: Effort normal and breath sounds normal. No stridor. He has no wheezes. He has no rales.  Abdominal: Soft. Bowel sounds are normal. He exhibits no distension. There is no abdominal tenderness.  Musculoskeletal:        General: No edema.     Cervical back: Neck supple.  Neurological: He is alert and oriented to person, place, and time.  He has intact cranial nerves (2-12).  Skin: Skin is warm and moist.   CARDIAC DATABASE: EKG: 10/21/21: Sinus bradycardia, 53 bpm, LVH per voltage criteria, without underlying injury pattern.  Echocardiogram: 11/21/2021: Normal LV systolic function with EF 60%. Left ventricle cavity is normal in size. Normal global wall motion. Normal diastolic filling pattern. Calculated EF 60%. Trileaflet aortic valve. No evidence of aortic stenosis. Mild (Grade I) aortic regurgitation. Mild mitral valve leaflet thickening. Moderate prolapse of the mitral valve  leaflets. Mild to moderate mitral regurgitation.  Stress Testing: No results found for this or any previous visit from the past 1095 days.   Heart Catheterization: None  LABORATORY DATA:    Latest Ref Rng & Units 07/23/2021   10:04 AM  CBC  WBC 3.4 - 10.8 x10E3/uL 3.1   Hemoglobin 13.0 - 17.7 g/dL 14.3   Hematocrit 37.5 - 51.0 % 41.4   Platelets 150 - 450 x10E3/uL 192        Latest Ref Rng & Units 07/23/2021   10:04 AM  CMP  Glucose 70 - 99 mg/dL 88   BUN 8 - 27 mg/dL 16   Creatinine 0.76 - 1.27 mg/dL 1.09   Sodium 134 - 144 mmol/L 141   Potassium 3.5 - 5.2 mmol/L 4.7   Chloride 96 - 106 mmol/L 103   CO2 20 - 29 mmol/L 22   Calcium 8.6 - 10.2 mg/dL 9.5   Total Protein 6.0 - 8.5 g/dL 6.9   Total Bilirubin 0.0 - 1.2 mg/dL 0.4   Alkaline Phos 44 - 121 IU/L 90   AST 0 - 40 IU/L 16   ALT 0 - 44 IU/L 16     Lipid Panel     Component Value Date/Time   CHOL 247 (H) 09/15/2021 1137   TRIG 62 09/15/2021 1137   HDL 71 09/15/2021 1137   CHOLHDL 3.5 09/15/2021 1137   LDLCALC 166 (H) 09/15/2021 1137   LABVLDL 10 09/15/2021 1137    No components found for: "NTPROBNP" No results for input(s): "PROBNP" in the last 8760 hours. Recent Labs    07/23/21 1004  TSH 1.330    BMP Recent Labs    07/23/21 1004  NA 141  K 4.7  CL 103  CO2 22  GLUCOSE 88  BUN 16  CREATININE 1.09  CALCIUM 9.5    HEMOGLOBIN A1C Lab Results  Component Value Date   HGBA1C 5.7 (H) 09/15/2021    IMPRESSION:    ICD-10-CM   1. Nonrheumatic mitral valve regurgitation  I34.0 losartan (COZAAR) 25 MG tablet    Basic metabolic panel    PCV ECHOCARDIOGRAM COMPLETE    2. Pure hypercholesterolemia  E78.00     3. Prediabetes  R73.03        RECOMMENDATIONS: Cristian Tran is a 71 y.o. African-American male whose past medical history and cardiac risk factors include: Mitral and aortic regurgitation, hypercholesterolemia, prediabetes.  Referred to the practice for evaluation of a cardiac  murmur.  On physical examination noted to have murmur consistent with mitral regurgitation.  Since last office visit an echocardiogram was performed which notes mild to moderate MR with mitral valve prolapse and mild aortic regurgitation.  Clinically he remains asymptomatic.  However, his blood pressures at times are elevated.  Shared decision was to start losartan 25 mg p.o. every morning with labs in 1 week to evaluate kidney function and electrolytes.  We will repeat an echocardiogram in 1 year to evaluate disease progression.  Patient also carries  a history/diagnosis of prediabetes and hyperlipidemia which he is currently optimizing his lifestyle and diet and will have it reevaluated with PCP prior to starting pharmacological therapy.  Patient is asked to seek medical attention if he has new onset of chest pain or shortness of breath either at rest or with effort related activities.  Otherwise I will like to see him back on an annual basis.  We will repeat an echocardiogram prior to next office visit.  FINAL MEDICATION LIST END OF ENCOUNTER: Meds ordered this encounter  Medications   losartan (COZAAR) 25 MG tablet    Sig: Take 1 tablet (25 mg total) by mouth every morning.    Dispense:  90 tablet    Refill:  0    There are no discontinued medications.   Current Outpatient Medications:    losartan (COZAAR) 25 MG tablet, Take 1 tablet (25 mg total) by mouth every morning., Disp: 90 tablet, Rfl: 0  Orders Placed This Encounter  Procedures   Basic metabolic panel   PCV ECHOCARDIOGRAM COMPLETE    There are no Patient Instructions on file for this visit.   --Continue cardiac medications as reconciled in final medication list. --Return in about 1 year (around 12/10/2022) for Follow up MR. or sooner if needed. --Continue follow-up with your primary care physician regarding the management of your other chronic comorbid conditions.  Patient's questions and concerns were addressed to his  satisfaction. He voices understanding of the instructions provided during this encounter.   This note was created using a voice recognition software as a result there may be grammatical errors inadvertently enclosed that do not reflect the nature of this encounter. Every attempt is made to correct such errors.  Tessa Lerner, [place], [facility]  Pager: [phone] Office: [phone]

## 2021-12-10 NOTE — Progress Notes (Signed)
Tried calling no answer left a vm

## 2021-12-11 NOTE — Progress Notes (Signed)
Patient called back, patient stated that provider had already discussed results with him during his last OV.

## 2022-11-30 ENCOUNTER — Other Ambulatory Visit: Payer: Self-pay

## 2022-12-04 ENCOUNTER — Encounter (HOSPITAL_COMMUNITY): Payer: Self-pay | Admitting: Cardiology

## 2022-12-06 DIAGNOSIS — J020 Streptococcal pharyngitis: Secondary | ICD-10-CM | POA: Diagnosis not present

## 2022-12-06 DIAGNOSIS — R051 Acute cough: Secondary | ICD-10-CM | POA: Diagnosis not present

## 2022-12-06 DIAGNOSIS — U071 COVID-19: Secondary | ICD-10-CM | POA: Diagnosis not present

## 2022-12-10 ENCOUNTER — Ambulatory Visit: Payer: Self-pay | Admitting: Cardiology

## 2022-12-21 ENCOUNTER — Encounter: Payer: Self-pay | Admitting: Nurse Practitioner

## 2022-12-21 ENCOUNTER — Ambulatory Visit (INDEPENDENT_AMBULATORY_CARE_PROVIDER_SITE_OTHER): Payer: Medicare HMO | Admitting: Nurse Practitioner

## 2022-12-21 VITALS — BP 136/84 | HR 70 | Wt 192.2 lb

## 2022-12-21 DIAGNOSIS — M02371 Reiter's disease, right ankle and foot: Secondary | ICD-10-CM | POA: Insufficient documentation

## 2022-12-21 MED ORDER — IBUPROFEN 600 MG PO TABS: 600.0000 mg | ORAL_TABLET | Freq: Two times a day (BID) | ORAL | 0 refills | Status: DC

## 2022-12-21 NOTE — Patient Instructions (Addendum)
Reactive Arthritis Reactive arthritis is joint inflammation that is caused by an infection somewhere else in the body. Inflammation often affects the knee, ankle, and foot joints. It may affect other parts of the body as well. Symptoms of reactive arthritis may last for several months and then go away completely. In some cases, symptoms may go away and then come back. Reactive arthritis can also become a long-term (chronic) illness. What are the causes? Reactive arthritis develops after you have an infection in your body, often in the intestines, genitals, or urinary tract. Infections that may trigger reactive arthritis include: Food poisoning caused by bacteria such as salmonella, shigella, campylobacter, or yersinia. Strep throat infection The exact reason why reactive arthritis develops is not known. What increases the risk? You are more likely to develop this condition if: You have a gene that is linked to reactive arthritis (HLA-B27). This gene is passed from parent to child (inherited). You are a male  You have a weakened disease-fighting system (immunesystem) due to HIV or cancer. Your condition may be more likely to be chronic if you have: The HLA-B27 gene. Repeated infections. A family history of arthritis. What are the signs or symptoms? Symptoms of this condition may include: Pain and swelling in your joints. The knee, ankle, and foot joints are often affected. Pain and swelling may also affect your skin, eyes, and the part of your body that drains urine from your bladder (urethra). Pain in your lower back, feet, or pelvis. Thick, crusty, reddish-purple sores on the palms of your hands and bottoms (soles) of your feet. Low-grade fever. Frequent or painful urination. Genital sores, which may be painful and can become infected. Blurred vision, eye pain, and red, sore eyes. Eyelids may stick together in the morning. Sores in your mouth. These may be painful or painless. How is this  diagnosed? This condition may be diagnosed based on: Your symptoms and medical history. Diagnosis can be difficult because symptoms may develop at different times. A physical exam. Blood tests to rule out other conditions and check for the HLA-B27 gene. X-rays of affected joints. Urine tests. How is this treated? The goal of treatment is to relieve symptoms and to treat any underlying infection. Treatment may include: Antibiotic medicines to treat a triggering infection. However, these medicines may not stop reactive arthritis from developing. NSAIDs to reduce joint pain, swelling, and stiffness. These medicines will not prevent further joint damage caused by reactive arthritis. Injections of medicines to reduce inflammation (corticosteroids), in cases of severe inflammation. Medicines to decrease symptoms of chronic reactive arthritis (disease-modifying antirheumatic drugs, DMARDs). These types of medicines include sulfasalazine or methotrexate. The medicines may take several weeks to become effective, but they can relieve chronic symptoms. Medicines that reduce the activity of the body's immune system (biologics), in severe cases. Symptoms that affect the eyes and skin, such as sores or blurred vision, often go away without treatment. Follow these instructions at home: Medicines Take over-the-counter and prescription medicines only as told by your health care provider. If you were prescribed an antibiotic, take it as told by your health care provider. Do not stop taking the antibiotic even if your condition improves. Managing pain, stiffness, and swelling     If directed, apply heat to affected areas before you exercise, or as needed. Heat can reduce the stiffness of your muscles and joints. Use the heat source that your health care provider recommends, such as a moist heat pack or a heating pad. Place a towel between  your skin and the heat source. Leave the heat on for 20-30  minutes. Remove the heat if your skin turns bright red. This is especially important if you are unable to feel pain, heat, or cold. You have a greater risk of getting burned. If directed, put ice on affected areas after you exercise, or as needed. Icing can reduce pain or swelling of your joints. To do this: Put ice in a plastic bag. Place a towel between your skin and the bag. Leave the ice on for 20 minutes, 2-3 times a day. Remove the ice if your skin turns bright red. This is very important. If you cannot feel pain, heat, or cold, you have a greater risk of damage to the area. General instructions Eat a healthy diet that includes plenty of fruits and vegetables, whole grains, lean protein, and low-fat or nonfat dairy products. Exercise regularly. Exercise can reduce your pain and help you maintain strength and flexibility. Talk with your health care provider or a physical therapist about what types of exercise are best for you. Your health care provider or physical therapist may recommend: Stretching. Strengthening exercises. Activities that are easier on your joints (low-impact activities), such as swimming, walking, water aerobics, or bicycling. Use condoms correctly every time you have sex. Keep all follow-up visits. This is important. Contact a health care provider if: You have pain that does not get better with medicine. You have swelling or a feeling of warmth in a joint that gets worse. You have a fever. You have redness, irritation, or pain in your eyes. You have blurred vision. Get help right away if: You have vision loss. This symptom may be an emergency. Get help right away. Call 911. Do not wait to see if the symptom will go away. Do not drive yourself to the hospital. Summary Reactive arthritis is joint inflammation caused by an infection somewhere else in the body. The infection is often in the intestines, genitals, or urinary tract. Pain and swelling often affect the  knee, ankle, and foot joints. It also may affect the skin, eyes, and the urethra. The goal of treatment is to relieve symptoms and to treat any underlying infections. Contact your health care provider if you have pain that does not get better with medicine. Get help right away if you have vision loss. This information is not intended to replace advice given to you by your health care provider. Make sure you discuss any questions you have with your health care provider. Document Revised: 01/01/2021 Document Reviewed: 01/01/2021 Elsevier Patient Education  2024 [employer].

## 2022-12-21 NOTE — Assessment & Plan Note (Addendum)
Right ankle swelling without pain, occurring after recent strep throat infection. No history of gout or other arthritis. ROM intact. No associated symptoms. He has been icing the ankle with no significant change. No alarm symptoms present at this time with benign exam with exception of swelling. Afebrile. No cardiac concerns at this time.  -Start Ibuprofen twice daily for one week to reduce inflammation and swelling. It may take up to 2 months for absolute resolution. If no change with ibuprofen, you may discontinue this.  -Continue normal activities as tolerated. If pain is noted, rest the ankle. -Printed information on condition provided to patient for reference. -Physical is scheduled for next month so we can recheck at that time.

## 2022-12-21 NOTE — Progress Notes (Signed)
  Tollie Eth, DNP, AGNP-c [facility] [address] [place], [place] [address] [phone]   ACUTE VISIT- ESTABLISHED PATIENT  Blood pressure 136/84, pulse 70, weight 192 lb 3.2 oz (87.2 kg).  Subjective:  HPI Cristian Tran is a 72 y.o. male presents to day for evaluation of acute concern(s).  History of Present Illness [name], a 72 year old male with no significant past medical history, presents with a week-long history of right ankle swelling. The patient denies any associated pain, but notes the swelling is significant. He denies any history of gout or similar symptoms in the past. The patient is active, working outside daily in [employer]. He has tried icing the ankle with no significant improvement in swelling.  The patient recently recovered from a dual diagnosis of COVID-19 and strep throat, for which he completed a course of penicillin. He denies any other medication use. The patient denies any other symptoms, including fever. He completed antibiotics for strep on Sunday after being diagnosed 10 days ago.  No other abnormalities were noted on examination.   ROS negative except for what is listed in HPI. History, Medications, Surgery, SDOH, and Family History reviewed and updated as appropriate.  Objective:  Physical Exam Cardiovascular:     Rate and Rhythm: Normal rate and regular rhythm.     Pulses: Normal pulses.     Heart sounds: Murmur heard.  Musculoskeletal:        General: Swelling present. Normal range of motion.       Legs:  Skin:    General: Skin is warm and dry.     Capillary Refill: Capillary refill takes less than 2 seconds.  Neurological:     General: No focal deficit present.     Mental Status: He is oriented to person, place, and time.     Sensory: No sensory deficit.     Motor: No weakness.     Coordination: Coordination normal.     Gait: Gait normal.  Psychiatric:        Mood and Affect: Mood normal.        Behavior:  Behavior normal.          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 No history of gout or other arthritis. ROM intact. No associated symptoms. He has been icing the ankle with no significant change. No alarm symptoms present at this time with benign exam with exception of swelling. Afebrile. No cardiac concerns at this time.  -Start Ibuprofen twice daily for one week to reduce inflammation and swelling. It may take up to 2 months for absolute resolution. If no change with ibuprofen, you may discontinue this.  -Continue normal activities as tolerated. If pain is noted, rest the ankle. -Printed information on condition provided to patient for reference. -Physical is scheduled for next month so we can recheck at that time.       Relevant Medications   ibuprofen (ADVIL) 600 MG tablet      Tollie Eth, DNP, AGNP-c

## 2023-01-12 ENCOUNTER — Encounter: Payer: 59 | Admitting: Medical

## 2023-01-17 ENCOUNTER — Other Ambulatory Visit: Payer: Self-pay | Admitting: Nurse Practitioner

## 2023-01-18 NOTE — Telephone Encounter (Signed)
Last apt 12/21/22 next apt 02/25/23.

## 2023-02-25 ENCOUNTER — Encounter: Payer: 59 | Admitting: Medical

## 2023-03-27 HISTORY — PX: NO PAST SURGERIES: SHX2092

## 2023-03-30 ENCOUNTER — Telehealth: Payer: Self-pay | Admitting: Internal Medicine

## 2023-03-30 ENCOUNTER — Other Ambulatory Visit: Payer: Self-pay | Admitting: Medical

## 2023-03-30 DIAGNOSIS — Z125 Encounter for screening for malignant neoplasm of prostate: Secondary | ICD-10-CM

## 2023-03-30 DIAGNOSIS — R972 Elevated prostate specific antigen [PSA]: Secondary | ICD-10-CM

## 2023-03-30 DIAGNOSIS — Z0000 Encounter for general adult medical examination without abnormal findings: Secondary | ICD-10-CM

## 2023-03-30 DIAGNOSIS — Z13220 Encounter for screening for lipoid disorders: Secondary | ICD-10-CM

## 2023-03-30 DIAGNOSIS — R7301 Impaired fasting glucose: Secondary | ICD-10-CM

## 2023-03-30 NOTE — Telephone Encounter (Signed)
Pt is coming in the morning for labs as his CPE is in the afternoon. Can you put in future orders

## 2023-03-31 ENCOUNTER — Ambulatory Visit (INDEPENDENT_AMBULATORY_CARE_PROVIDER_SITE_OTHER): Payer: Medicare HMO | Admitting: Medical

## 2023-03-31 ENCOUNTER — Encounter: Payer: Self-pay | Admitting: Medical

## 2023-03-31 VITALS — BP 142/80 | HR 59 | Ht 75.0 in | Wt 191.6 lb

## 2023-03-31 DIAGNOSIS — Z1389 Encounter for screening for other disorder: Secondary | ICD-10-CM | POA: Insufficient documentation

## 2023-03-31 DIAGNOSIS — Z125 Encounter for screening for malignant neoplasm of prostate: Secondary | ICD-10-CM

## 2023-03-31 DIAGNOSIS — R7301 Impaired fasting glucose: Secondary | ICD-10-CM | POA: Insufficient documentation

## 2023-03-31 DIAGNOSIS — Z1211 Encounter for screening for malignant neoplasm of colon: Secondary | ICD-10-CM | POA: Insufficient documentation

## 2023-03-31 DIAGNOSIS — R972 Elevated prostate specific antigen [PSA]: Secondary | ICD-10-CM | POA: Insufficient documentation

## 2023-03-31 DIAGNOSIS — Z7185 Encounter for immunization safety counseling: Secondary | ICD-10-CM | POA: Diagnosis not present

## 2023-03-31 DIAGNOSIS — Z136 Encounter for screening for cardiovascular disorders: Secondary | ICD-10-CM | POA: Diagnosis not present

## 2023-03-31 DIAGNOSIS — Z13220 Encounter for screening for lipoid disorders: Secondary | ICD-10-CM | POA: Diagnosis not present

## 2023-03-31 DIAGNOSIS — Z0000 Encounter for general adult medical examination without abnormal findings: Secondary | ICD-10-CM | POA: Diagnosis not present

## 2023-03-31 DIAGNOSIS — Z2820 Immunization not carried out because of patient decision for unspecified reason: Secondary | ICD-10-CM | POA: Insufficient documentation

## 2023-03-31 DIAGNOSIS — R030 Elevated blood-pressure reading, without diagnosis of hypertension: Secondary | ICD-10-CM | POA: Diagnosis not present

## 2023-03-31 LAB — POCT URINALYSIS DIP (PROADVANTAGE DEVICE)
Bilirubin, UA: NEGATIVE
Blood, UA: NEGATIVE
Glucose, UA: NEGATIVE mg/dL
Ketones, POC UA: NEGATIVE mg/dL
Leukocytes, UA: NEGATIVE
Nitrite, UA: NEGATIVE
Protein Ur, POC: NEGATIVE mg/dL
Specific Gravity, Urine: 1.015
Urobilinogen, Ur: NEGATIVE
pH, UA: 6 (ref 5.0–8.0)

## 2023-03-31 LAB — LIPID PANEL

## 2023-03-31 NOTE — Patient Instructions (Addendum)
 Optometrists nearby  [facility] [address] [place], [place] [address] [phone]  Dr. Lauraine Fass [address] [place], [place] [address] [phone]  [facility] [address] [place], [place] [address] [phone]  [facility], Dr. Abigail [address], [place], [place] [address] [phone]    Please call [facility] to set up appointment for elevated PSA. [address] [place], [place], [place] [address] [phone]

## 2023-03-31 NOTE — Progress Notes (Signed)
 Subjective:   HPI  Cristian Tran is a 73 y.o. male who presents for Chief Complaint  Patient presents with   Annual Exam    Cpe, had labs this morning, bp was elevated but been under a lot of stress and eating bad. Decline vaccines today. Would like cologuard    Patient Care Team: Azion Centrella, Alm GORMAN RIGGERS as PCP - General (Family Medicine) Sees dentist  Concerns: Needs Cologuard referral.  He has never had a colonoscopy screening.  He just wants to do the stool test  Does not currently have an eye doctor  Regarding elevated PSA at his last physical, he never went to see urology  He notes that he has not been eating healthy lately and attributes that to his elevated blood pressure today   Reviewed their medical, surgical, family, social, medication, and allergy history and updated chart as appropriate.  Past Medical History:  Diagnosis Date   Hyperlipidemia    Mitral regurgitation    Prediabetes     Past Surgical History:  Procedure Laterality Date   NO PAST SURGERIES  03/2023    Family History  Problem Relation Age of Onset   Hypertension Mother    Heart attack Brother     No current outpatient medications on file.  No Known Allergies   Review of Systems Constitutional: -fever, -chills, -sweats, -unexpected weight change, -decreased appetite, -fatigue Allergy: -sneezing, -itching, -congestion Dermatology: -changing moles, --rash, -lumps ENT: -runny nose, -ear pain, -sore throat, -hoarseness, -sinus pain, -teeth pain, - ringing in ears, -hearing loss, -nosebleeds Cardiology: -chest pain, -palpitations, -swelling, -difficulty breathing when lying flat, -waking up short of breath Respiratory: -cough, -shortness of breath, -difficulty breathing with exercise or exertion, -wheezing, -coughing up blood Gastroenterology: -abdominal pain, -nausea, -vomiting, -diarrhea, -constipation, -blood in stool, -changes in bowel movement, -difficulty swallowing or  eating Hematology: -bleeding, -bruising  Musculoskeletal: -joint aches, -muscle aches, -joint swelling, -back pain, -neck pain, -cramping, -changes in gait Ophthalmology: denies vision changes, eye redness, itching, discharge Urology: -burning with urination, -difficulty urinating, -blood in urine, -urinary frequency, -urgency, -incontinence Neurology: -headache, -weakness, -tingling, -numbness, -memory loss, -falls, -dizziness Psychology: -depressed mood, -agitation, -sleep problems Male GU: no testicular mass, pain, no lymph nodes swollen, no swelling, no rash.     03/31/2023    1:38 PM 12/21/2022   10:43 AM 09/15/2021   11:06 AM 07/23/2021    9:29 AM  Depression screen PHQ 2/9  Decreased Interest 0 0 0 0  Down, Depressed, Hopeless 0 0 0 0  PHQ - 2 Score 0 0 0 0        Objective:  BP (!) 142/80   Pulse (!) 59   Ht 6' 3 (1.905 m)   Wt 191 lb 9.6 oz (86.9 kg)   SpO2 98%   BMI 23.95 kg/m   General appearance: alert, no distress, WD/WN, African American male Skin: unremarkable, thickened bilat toenails, but otherwise unremarkable HEENT: normocephalic, conjunctiva/corneas normal, sclerae anicteric, PERRLA, EOMi, nares patent, no discharge or erythema, pharynx normal Neck: supple, no lymphadenopathy, no thyromegaly, no masses, normal ROM, no bruits Chest: non tender, normal shape and expansion Heart: +2/6 brief diastolic murmur in upper borders, otherwise RRR, normal S1, S2 Lungs: CTA bilaterally, no wheezes, rhonchi, or rales Abdomen: +bs, soft, non tender, non distended, no masses, no hepatomegaly, no splenomegaly, no bruits Back: non tender, normal ROM, no scoliosis Musculoskeletal: upper extremities non tender, no obvious deformity, normal ROM throughout, lower extremities non tender, no obvious deformity, normal ROM throughout  Extremities: no edema, no cyanosis, no clubbing Pulses: 2+ symmetric, upper and lower extremities, normal cap refill Neurological: alert, oriented x  3, CN2-12 intact, strength normal upper extremities and lower extremities, sensation normal throughout, DTRs 2+ throughout, no cerebellar signs, gait normal Psychiatric: normal affect, behavior normal, pleasant  GU: normal male external genitalia,circumcised, nontender, no masses, no hernia, no lymphadenopathy Rectal: anus normal tone, prostate mildly enlarged, no nodules    Assessment and Plan :   Encounter Diagnoses  Name Primary?   Encounter for health maintenance examination in adult    Impaired fasting blood sugar    Screening for prostate cancer    Elevated prostate specific antigen (PSA)    Encounter for lipid screening for cardiovascular disease    Vaccine refused by patient Yes   Screening for colon cancer    Vaccine counseling    Screening for hematuria or proteinuria    Elevated blood pressure reading without diagnosis of hypertension     This visit was a preventative care visit, also known as wellness visit or routine physical.   Topics typically include healthy lifestyle, diet, exercise, preventative care, vaccinations, sick and well care, proper use of emergency dept and after hours care, as well as other concerns.     Recommendations: Continue to return yearly for your annual wellness and preventative care visits.  This gives us  a chance to discuss healthy lifestyle, exercise, vaccinations, review your chart record, and perform screenings where appropriate.  I recommend you see your eye doctor yearly for routine vision care.  Establish with eye doctor  I recommend you see your dentist yearly for routine dental care including hygiene visits twice yearly.   Vaccination recommendations were reviewed Immunization History  Administered Date(s) Administered   Moderna Sars-Covid-2 Vaccination 03/19/2019, 04/16/2019   Tdap 09/15/2021    Shingles vaccine:  I recommend you have a shingles vaccine to help prevent shingles or herpes zoster outbreak.   Please call your  insurer to inquire about coverage for the Shingrix vaccine given in 2 doses.   Some insurers cover this vaccine after age 84, some cover this after age 45.  If your insurer covers this, then call to schedule appointment to have this vaccine here.  Consider a one time Prevnar 20 pneumococcal vaccine  Consider RSV vaccine as well.     Screening for cancer: Colon cancer screening: We discussed options for colon cancer screening. We will refer you for Cologuard   We discussed PSA, prostate exam, and prostate cancer screening risks/benefits.     Skin cancer screening: Check your skin regularly for new changes, growing lesions, or other lesions of concern Come in for evaluation if you have skin lesions of concern.  Lung cancer screening: If you have a greater than 20 pack year history of tobacco use, then you may qualify for lung cancer screening with a chest CT scan.   Please call your insurance company to inquire about coverage for this test.  We currently don't have screenings for other cancers besides breast, cervical, colon, and lung cancers.  If you have a strong family history of cancer or have other cancer screening concerns, please let me know.    Bone health: Get at least 150 minutes of aerobic exercise weekly Get weight bearing exercise at least once weekly Bone density test:  A bone density test is an imaging test that uses a type of X-ray to measure the amount of calcium and other minerals in your bones. The test may be used  to diagnose or screen you for a condition that causes weak or thin bones (osteoporosis), predict your risk for a broken bone (fracture), or determine how well your osteoporosis treatment is working. The bone density test is recommended for females 65 and older, or females or males <65 if certain risk factors such as thyroid disease, long term use of steroids such as for asthma or rheumatological issues, vitamin D deficiency, estrogen deficiency, family  history of osteoporosis, self or family history of fragility fracture in first degree relative.    Heart health: Get at least 150 minutes of aerobic exercise weekly Limit alcohol It is important to maintain a healthy blood pressure and healthy cholesterol numbers  Heart disease screening: Screening for heart disease includes screening for blood pressure, fasting lipids, glucose/diabetes screening, BMI height to weight ratio, reviewed of smoking status, physical activity, and diet.    Goals include blood pressure 120/80 or less, maintaining a healthy lipid/cholesterol profile, preventing diabetes or keeping diabetes numbers under good control, not smoking or using tobacco products, exercising most days per week or at least 150 minutes per week of exercise, and eating healthy variety of fruits and vegetables, healthy oils, and avoiding unhealthy food choices like fried food, fast food, high sugar and high cholesterol foods.     Echocardiogram 10/2021 Dr. Michele: Left ventricular ejection fraction (pumping activity) of the heart is within the normal limit. Mild aortic regurgitation and mild to moderate mitral regurgitation.    Medical care options: I recommend you continue to seek care here first for routine care.  We try really hard to have available appointments Monday through Friday daytime hours for sick visits, acute visits, and physicals.  Urgent care should be used for after hours and weekends for significant issues that cannot wait till the next day.  The emergency department should be used for significant potentially life-threatening emergencies.  The emergency department is expensive, can often have long wait times for less significant concerns, so try to utilize primary care, urgent care, or telemedicine when possible to avoid unnecessary trips to the emergency department.  Virtual visits and telemedicine have been introduced since the pandemic started in 2020, and can be convenient ways  to receive medical care.  We offer virtual appointments as well to assist you in a variety of options to seek medical care.   Advanced Directives: I recommend you consider completing a Health Care Power of Attorney and Living Will.   These documents respect your wishes and help alleviate burdens on your loved ones if you were to become terminally ill or be in a position to need those documents enforced.    You can complete Advanced Directives yourself, have them notarized, then have copies made for our office, for you and for anybody you feel should have them in safe keeping.  Or, you can have an attorney prepare these documents.   If you haven't updated your Last Will and Testament in a while, it may be worthwhile having an attorney prepare these documents together and save on some costs.       Separate significant issues discussed: We discussed his labs from 2023 including elevated PSA.  We made a referral to urology at that time and they made multiple attempts to call him without success.  We discussed the importance of that finding.  I advise he go ahead and call urology today to get in for an appointment.  I placed a new referral.  Updated labs today.  Elevated blood pressures today-labs  today, will likely recommend initiating blood pressure medication given prior findings  Impaired fasting glucose-updated labs today, counseled on diet and exercise    Cesareo Vickrey was seen today for annual exam.  Diagnoses and all orders for this visit:  Vaccine refused by patient  Encounter for health maintenance examination in adult -     Hemoglobin A1c -     PSA -     CBC -     Comprehensive metabolic panel -     Lipid panel -     Cologuard; Future -     POCT Urinalysis DIP (Proadvantage Device)  Impaired fasting blood sugar -     Hemoglobin A1c -     POCT Urinalysis DIP (Proadvantage Device)  Screening for prostate cancer -     PSA  Elevated prostate specific antigen (PSA) -      PSA -     Ambulatory referral to Urology  Encounter for lipid screening for cardiovascular disease -     Lipid panel  Screening for colon cancer -     Cologuard; Future  Vaccine counseling  Screening for hematuria or proteinuria  Elevated blood pressure reading without diagnosis of hypertension    follow-up pending labs, yearly for physical

## 2023-04-01 LAB — COMPREHENSIVE METABOLIC PANEL
ALT: 10 IU/L (ref 0–44)
AST: 15 IU/L (ref 0–40)
Albumin: 4.1 g/dL (ref 3.9–4.9)
Alkaline Phosphatase: 90 IU/L (ref 44–121)
BUN/Creatinine Ratio: 14 (ref 10–24)
BUN: 14 mg/dL (ref 8–27)
Bilirubin Total: 0.5 mg/dL (ref 0.0–1.2)
CO2: 24 mmol/L (ref 20–29)
Calcium: 9.2 mg/dL (ref 8.6–10.2)
Chloride: 103 mmol/L (ref 96–106)
Creatinine, Ser: 0.98 mg/dL (ref 0.76–1.27)
Globulin, Total: 2.2 g/dL (ref 1.5–4.5)
Glucose: 79 mg/dL (ref 70–99)
Potassium: 4.7 mmol/L (ref 3.5–5.2)
Sodium: 139 mmol/L (ref 134–144)
Total Protein: 6.3 g/dL (ref 6.0–8.5)
eGFR: 83 mL/min/{1.73_m2} (ref >59)

## 2023-04-01 LAB — LIPID PANEL
Cholesterol, Total: 268 mg/dL — ABNORMAL HIGH (ref 100–199)
HDL: 81 mg/dL (ref >39)
LDL CALC COMMENT:: 3.3 ratio (ref 0.0–5.0)
LDL Chol Calc (NIH): 174 mg/dL — ABNORMAL HIGH (ref 0–99)
Triglycerides: 81 mg/dL (ref 0–149)
VLDL Cholesterol Cal: 13 mg/dL (ref 5–40)

## 2023-04-01 LAB — CBC
Hematocrit: 39.7 % (ref 37.5–51.0)
Hemoglobin: 14 g/dL (ref 13.0–17.7)
MCH: 33.3 pg — ABNORMAL HIGH (ref 26.6–33.0)
MCHC: 35.3 g/dL (ref 31.5–35.7)
MCV: 95 fL (ref 79–97)
Platelets: 177 10*3/uL (ref 150–450)
RBC: 4.2 x10E6/uL (ref 4.14–5.80)
RDW: 12.8 % (ref 11.6–15.4)
WBC: 3.4 10*3/uL (ref 3.4–10.8)

## 2023-04-01 LAB — HEMOGLOBIN A1C
Est. average glucose Bld gHb Est-mCnc: 111 mg/dL
Hgb A1c MFr Bld: 5.5 % (ref 4.8–5.6)

## 2023-04-01 LAB — PSA: Prostate Specific Ag, Serum: 4.6 ng/mL — ABNORMAL HIGH (ref 0.0–4.0)

## 2023-04-01 NOTE — Progress Notes (Signed)
 Labs show normal blood counts, diabetes marker normal, liver kidney and electrolytes normal  Prostate marker was still abnormal.  Cholesterol too high putting you at risk for heart disease.  I recommend you start a medication a bedtime daily called Pravachol to lower cholesterol and lower risk of heart disease and stroke along with healthy diet.  Let me know if agreeable to this  Expect phone call about Urology referral if you didn't already schedule

## 2023-05-04 ENCOUNTER — Ambulatory Visit: Admitting: Family Medicine

## 2023-05-04 VITALS — BP 130/80 | HR 57 | Temp 97.7°F | Wt 188.8 lb

## 2023-05-04 DIAGNOSIS — R051 Acute cough: Secondary | ICD-10-CM

## 2023-05-04 MED ORDER — BENZONATATE 100 MG PO CAPS: 200.0000 mg | ORAL_CAPSULE | Freq: Three times a day (TID) | ORAL | 0 refills | Status: AC | PRN

## 2023-05-04 NOTE — Progress Notes (Signed)
   Subjective:    Patient ID: Cristian Tran, male    DOB: 08/25/1950, 73 y.o.   MRN: [id number]  HPI He has a 4-day history of cough and slight sore throat but no fever, chills, earache, shortness of breath.  He has been using NyQuil and Mucinex.   Review of Systems     Objective:    Physical Exam Alert and in no distress. Tympanic membranes and canals are normal. Pharyngeal area is normal. Neck is supple without adenopathy or thyromegaly. Cardiac exam shows a regular sinus rhythm without murmurs or gallops. Lungs are clear to auscultation.        Assessment & Plan:  Marland Kitchen  Acute cough - Plan: benzonatate (TESSALON) 100 MG capsule  Recommend he continue with his previous medication regimen and add Tessalon to the regimen.  He was comfortable with that

## 2023-05-07 ENCOUNTER — Telehealth: Payer: Self-pay | Admitting: *Deleted

## 2023-05-07 NOTE — Telephone Encounter (Signed)
 Copied from CRM [id number]. Topic: Clinical - Medication Question >> May 07, 2023  2:51 PM Almira Coaster wrote: Reason for CRM: Patient was seen on 05/04/2023 and prescribed benzonatate (TESSALON) 100 MG capsule for his cought he would like to know if he can take any over the counter medication for congestion, Best call back number is [phone].  Called patient back and left message letting him know that per note Dr Susann Givens said it was okay to continue original regimen of NyQuil and mucinex in addition to the tessalon. The mucinex should help with congestion. If he had anyp there questions, he could call back.

## 2023-05-14 DIAGNOSIS — R972 Elevated prostate specific antigen [PSA]: Secondary | ICD-10-CM | POA: Diagnosis not present

## 2023-08-25 DIAGNOSIS — R972 Elevated prostate specific antigen [PSA]: Secondary | ICD-10-CM | POA: Diagnosis not present

## 2023-12-07 ENCOUNTER — Ambulatory Visit

## 2024-04-04 ENCOUNTER — Ambulatory Visit: Payer: Self-pay

## 2024-04-10 ENCOUNTER — Encounter: Payer: Medicare HMO | Admitting: Medical
